# Patient Record
Sex: Female | Born: 1955 | Race: White | Hispanic: No | Smoking: Former smoker
Health system: Southern US, Community
[De-identification: ages and names within clinical notes are randomized; demographics above are authoritative.]

## PROBLEM LIST (undated history)

## (undated) DIAGNOSIS — M1990 Unspecified osteoarthritis, unspecified site: Secondary | ICD-10-CM

## (undated) DIAGNOSIS — E785 Hyperlipidemia, unspecified: Secondary | ICD-10-CM

## (undated) DIAGNOSIS — K219 Gastro-esophageal reflux disease without esophagitis: Secondary | ICD-10-CM

## (undated) DIAGNOSIS — R7303 Prediabetes: Secondary | ICD-10-CM

## (undated) DIAGNOSIS — G510 Bell's palsy: Secondary | ICD-10-CM

## (undated) DIAGNOSIS — E669 Obesity, unspecified: Secondary | ICD-10-CM

## (undated) HISTORY — DX: Hyperlipidemia, unspecified: E78.5

## (undated) HISTORY — DX: Prediabetes: R73.03

## (undated) HISTORY — DX: Gastro-esophageal reflux disease without esophagitis: K21.9

## (undated) HISTORY — DX: Unspecified osteoarthritis, unspecified site: M19.90

## (undated) HISTORY — DX: Bell's palsy: G51.0

## (undated) HISTORY — PX: TOTAL KNEE ARTHROPLASTY: SHX125

## (undated) HISTORY — DX: Obesity, unspecified: E66.9

## (undated) HISTORY — PX: TOTAL ABDOMINAL HYSTERECTOMY: SHX209

## (undated) HISTORY — PX: ABDOMINAL HYSTERECTOMY: SHX81

---

## 2012-03-17 ENCOUNTER — Emergency Department (HOSPITAL_COMMUNITY): Payer: BC Managed Care – PPO

## 2012-03-17 ENCOUNTER — Encounter (HOSPITAL_COMMUNITY): Payer: Self-pay | Admitting: Emergency Medicine

## 2012-03-17 ENCOUNTER — Emergency Department (HOSPITAL_COMMUNITY)
Admission: EM | Admit: 2012-03-17 | Discharge: 2012-03-17 | Disposition: A | Discharge status: Home / Self Care | Payer: BC Managed Care – PPO | Attending: Emergency Medicine | Admitting: Emergency Medicine

## 2012-03-17 DIAGNOSIS — G510 Bell's palsy: Secondary | ICD-10-CM | POA: Insufficient documentation

## 2012-03-17 LAB — COMPREHENSIVE METABOLIC PANEL
ALT: 18 U/L (ref 0–35)
AST: 18 U/L (ref 0–37)
Albumin: 3.9 g/dL (ref 3.5–5.2)
Alkaline Phosphatase: 82 U/L (ref 39–117)
BUN: 14 mg/dL (ref 6–23)
CO2: 24 mEq/L (ref 19–32)
Calcium: 10 mg/dL (ref 8.4–10.5)
Chloride: 104 mEq/L (ref 96–112)
Creatinine, Ser: 0.69 mg/dL (ref 0.50–1.10)
GFR calc Af Amer: >90 mL/min (ref >90)
GFR calc non Af Amer: >90 mL/min (ref >90)
Glucose, Bld: 99 mg/dL (ref 70–99)
Potassium: 4 mEq/L (ref 3.5–5.1)
Sodium: 140 mEq/L (ref 135–145)
Total Bilirubin: 0.3 mg/dL (ref 0.3–1.2)
Total Protein: 7.2 g/dL (ref 6.0–8.3)

## 2012-03-17 LAB — DIFFERENTIAL
Basophils Absolute: 0 10*3/uL (ref 0.0–0.1)
Basophils Relative: 1 % (ref 0–1)
Eosinophils Absolute: 0.2 10*3/uL (ref 0.0–0.7)
Eosinophils Relative: 4 % (ref 0–5)
Lymphocytes Relative: 42 % (ref 12–46)
Lymphs Abs: 2.4 10*3/uL (ref 0.7–4.0)
Monocytes Absolute: 0.3 10*3/uL (ref 0.1–1.0)
Monocytes Relative: 6 % (ref 3–12)
Neutro Abs: 2.7 10*3/uL (ref 1.7–7.7)
Neutrophils Relative %: 48 % (ref 43–77)

## 2012-03-17 LAB — PROTIME-INR
INR: 1.01 (ref 0.00–1.49)
Prothrombin Time: 13.2 seconds (ref 11.6–15.2)

## 2012-03-17 LAB — POCT I-STAT, CHEM 8
BUN: 14 mg/dL (ref 6–23)
Calcium, Ion: 1.27 mmol/L — ABNORMAL HIGH (ref 1.12–1.23)
Chloride: 106 mEq/L (ref 96–112)
Creatinine, Ser: 0.7 mg/dL (ref 0.50–1.10)
Glucose, Bld: 97 mg/dL (ref 70–99)
HCT: 39 % (ref 36.0–46.0)
Hemoglobin: 13.3 g/dL (ref 12.0–15.0)
Potassium: 3.9 mEq/L (ref 3.5–5.1)
Sodium: 142 mEq/L (ref 135–145)
TCO2: 26 mmol/L (ref 0–100)

## 2012-03-17 LAB — CBC
HCT: 38.8 % (ref 36.0–46.0)
Hemoglobin: 12.6 g/dL (ref 12.0–15.0)
MCH: 29.3 pg (ref 26.0–34.0)
MCHC: 32.5 g/dL (ref 30.0–36.0)
MCV: 90.2 fL (ref 78.0–100.0)
Platelets: 246 10*3/uL (ref 150–400)
RBC: 4.3 MIL/uL (ref 3.87–5.11)
RDW: 13.5 % (ref 11.5–15.5)
WBC: 5.7 10*3/uL (ref 4.0–10.5)

## 2012-03-17 LAB — TROPONIN I: Troponin I: <0.3 ng/mL (ref <0.30)

## 2012-03-17 LAB — APTT: aPTT: 32 seconds (ref 24–37)

## 2012-03-17 MED ORDER — PREDNISONE 20 MG PO TABS: 60.0000 mg | ORAL_TABLET | Freq: Every day | ORAL | Status: DC

## 2012-03-17 MED ORDER — LORAZEPAM 2 MG/ML IJ SOLN
1.0000 mg | Freq: Once | INTRAMUSCULAR | Status: AC
Start: 2012-03-17 — End: 2012-03-17
  Administered 2012-03-17: 1 mg via INTRAVENOUS
  Filled 2012-03-17: qty 1

## 2012-03-17 MED ORDER — PREDNISONE (PAK) 10 MG PO TABS: ORAL_TABLET | ORAL | Status: DC

## 2012-03-17 NOTE — ED Provider Notes (Signed)
History     CSN: [id number] Arrival date & time 03/17/12  5621  First MD Initiated Contact with Patient 03/17/12 0757      Chief Complaint  Patient presents with  . Facial Droop    The history is provided by the patient.   the patient noticed yesterday that she bit her lip but did not think much of it. When she woke up this morning she was brushing her teeth and felt that the left side of her face was numb. She also noticed that she was having trouble moving the left side of her mouth. The patient went to work but her coworkers encouraged her to come to the hospital. They noticed that she was having difficulty moving the left side of her face. Patient denies any headache. She denies any trouble with her balance or coordination. She denies any weakness or numbness in her extremities. Patient has never had anything similar to this. She has not noticed anything that makes it particularly better or worse although the symptoms do seem to be increasing since this morning  No past medical history on file.  No past surgical history on file.  No family history on file.  History  Substance Use Topics  . Smoking status: Not on file  . Smokeless tobacco: Not on file  . Alcohol Use: Not on file    OB History    No data available      Review of Systems  All other systems reviewed and are negative.    Allergies  Review of patient's allergies indicates not on file.  Home Medications  No current outpatient prescriptions on file.  BP 157/75  Pulse 73  Temp 99.7 F (37.6 C) (Oral)  Resp 18  SpO2 97%  Physical Exam  Nursing note and vitals reviewed. Constitutional: She is oriented to person, place, and time. She appears well-developed and well-nourished. No distress.  HENT:  Head: Normocephalic and atraumatic.  Right Ear: External ear normal.  Left Ear: External ear normal.  Mouth/Throat: Oropharynx is clear and moist.  Eyes: Conjunctivae normal are normal. Right eye exhibits  no discharge. Left eye exhibits no discharge. No scleral icterus.  Neck: Neck supple. No tracheal deviation present.  Cardiovascular: Normal rate, regular rhythm and intact distal pulses.   Pulmonary/Chest: Effort normal and breath sounds normal. No stridor. No respiratory distress. She has no wheezes. She has no rales.  Abdominal: Soft. Bowel sounds are normal. She exhibits no distension. There is no tenderness. There is no rebound and no guarding.  Musculoskeletal: She exhibits no edema and no tenderness.  Neurological: She is alert and oriented to person, place, and time. She has normal strength. A cranial nerve deficit (Left-sided facial palsy with partial involvement of the left side of the forehead) is present. No sensory deficit. She exhibits normal muscle tone. She displays no seizure activity. Coordination normal.       No pronator drift bilateral upper extrem, able to hold both legs off bed for 5 seconds, sensation intact in all extremities, no visual field cuts, no left or right sided neglect  Skin: Skin is warm and dry. No rash noted.  Psychiatric: She has a normal mood and affect.    ED Course  Procedures (including critical care time)  Labs Reviewed  POCT I-STAT, CHEM 8 - Abnormal; Notable for the following:    Calcium, Ion 1.27 (*)     All other components within normal limits  PROTIME-INR  APTT  CBC  DIFFERENTIAL  COMPREHENSIVE METABOLIC PANEL  TROPONIN I   Mr Brain Wo Contrast  03/17/2012  *RADIOLOGY REPORT*  Clinical Data: Left-sided facial droop.  MRI HEAD WITHOUT CONTRAST  Technique:  Multiplanar, multiecho pulse sequences of the brain and surrounding structures were obtained according to standard protocol without intravenous contrast.  Comparison: None.  Findings: The patient had difficulty remaining motionless for the study. Ativan was given to aid with anxiety.  Images are suboptimal.  Small or subtle lesions could be overlooked.  There is no evidence for acute  infarction, intracranial hemorrhage, mass lesion, hydrocephalus, or extra-axial fluid.  There is no atrophy or white matter disease.  Major intracranial vessels structures appear patent.  There is no pituitary or cerebellar tonsil abnormality.  Calvarium and skull base appear intact.  There is no visible sinus or mastoid disease.  The orbits are negative. Within limits of detection on noncontrast brain MR, no visible temporal bone abnormality.  IMPRESSION: Negative exam.   Original Report Authenticated By: Davonna Belling, M.D.      1. Bell's palsy       MDM  Her exam and history is most suggestive of a Bell's palsy however the left side of the forehead is not completely involved. There is asymmetry in her right eyebrow it does raise higher than the left but we will get an MRI this morning to confirm no sign of cerebrovascular accident  MRI does not show CVA.  Consistent with bell's palsy.  Will rx steroids. Follow up with PCP        Celene Kras, MD 03/17/12 [id number]

## 2012-03-17 NOTE — ED Notes (Signed)
Pt reports left sided facial asymetry since yesterday afternoon, on arrival has equal grips, on slurred speech and no other neuro deficits, MD to bedside - states pt most likely has Bell's Palsey, took ASA pta, no pain, A/O X4, NAD

## 2012-03-17 NOTE — ED Notes (Signed)
PT made aware that she cannot drive for 6-8 hours. Pt states she had ride with friend.

## 2012-04-15 ENCOUNTER — Ambulatory Visit (INDEPENDENT_AMBULATORY_CARE_PROVIDER_SITE_OTHER): Payer: BC Managed Care – PPO | Admitting: Family Medicine

## 2012-04-15 VITALS — BP 128/85 | HR 64 | Temp 98.3°F | Resp 16 | Ht 65.0 in | Wt 250.2 lb

## 2012-04-15 DIAGNOSIS — H538 Other visual disturbances: Secondary | ICD-10-CM

## 2012-04-15 DIAGNOSIS — G510 Bell's palsy: Secondary | ICD-10-CM

## 2012-04-15 NOTE — Patient Instructions (Signed)
Your Bell's palsy symptoms appear to be improving - this can be a continual process over months. If you are not continuing to improve, or any change/worsening sooner - you may need to follow up with a neurologist.  We will refer you to an eye specialist to evaluate the blurry vision/trouble focusing - but this is in both eyes, and unlikely due to the Bell's palsy.  Return to the clinic or go to the nearest emergency room if any of your symptoms worsen or new symptoms occur.

## 2012-04-15 NOTE — Progress Notes (Signed)
  Subjective:    Patient ID: Teresa Strong, female    DOB: [birth year], 57 y.o.   MRN: [id number]  HPI Teresa Strong is a 57 y.o. female Seen in ER 03/17/12 for L sided facial weakness, diagnosed with Bell's palsy.  MRI brain WNL.  Rx prednisone at 60mg  , then taper.    Started improving last week - some improvement in face weakness. Still slight weakness in L mouth. Feels like eyesight is somewhat blurry, feels like in both sides, not just L eye.   Eyes watery, not dry. - hard to focus initially - but able to eventually focus. No other weakness.  No headache.   Last optho eval in 2012. establishing care.      Review of Systems  Eyes: Positive for discharge (watery only. ) and visual disturbance. Negative for photophobia, pain and redness.  Skin: Negative for rash.  Neurological: Positive for weakness (slight L lower face only.). Negative for headaches.       Objective:   Physical Exam  Vitals reviewed. Constitutional: She is oriented to person, place, and time. She appears well-developed and well-nourished. No distress.  HENT:  Head: Normocephalic and atraumatic.  Right Ear: External ear normal.  Left Ear: External ear normal.  Mouth/Throat: Oropharynx is clear and moist.  Eyes: EOM and lids are normal. Pupils are equal, round, and reactive to light. Right conjunctiva is not injected. Right conjunctiva has no hemorrhage. Left conjunctiva is not injected. Left conjunctiva has no hemorrhage.  Cardiovascular: Normal rate, regular rhythm, normal heart sounds and intact distal pulses.   Pulmonary/Chest: Effort normal and breath sounds normal.  Neurological: She is alert and oriented to person, place, and time. She has normal strength. She displays a negative Romberg sign. Coordination and gait normal.       No pronator drift.  Slight decreased movement in L mouth, able to smile with slight asymmetry. Able to close eye with forced closure and standard blink.   Skin: Skin is warm and dry. No  rash noted.  Psychiatric: She has a normal mood and affect. Her behavior is normal.   Vision noted.     Assessment & Plan:  Teresa Strong is a 57 y.o. female 1. Blurry vision  Ambulatory referral to Ophthalmology  2. Bell's palsy  Ambulatory referral to Ophthalmology   Improved palsy sx's, discussed neuro eval if not improving, and rtc/er precautions.   Blurry vision - bilateral. Unlikely d/t Bell's.  Able to close eyes, and watery, not dry eyes, but will refer to optho for eval.  Patient Instructions  Your Bell's palsy symptoms appear to be improving - this can be a continual process over months. If you are not continuing to improve, or any change/worsening sooner - you may need to follow up with a neurologist.  We will refer you to an eye specialist to evaluate the blurry vision/trouble focusing - but this is in both eyes, and unlikely due to the Bell's palsy.  Return to the clinic or go to the nearest emergency room if any of your symptoms worsen or new symptoms occur.

## 2012-05-25 ENCOUNTER — Encounter: Payer: BC Managed Care – PPO | Admitting: Family Medicine

## 2012-07-06 ENCOUNTER — Encounter: Payer: BC Managed Care – PPO | Admitting: Family Medicine

## 2012-08-24 ENCOUNTER — Ambulatory Visit (INDEPENDENT_AMBULATORY_CARE_PROVIDER_SITE_OTHER): Payer: BC Managed Care – PPO | Admitting: Family Medicine

## 2012-08-24 ENCOUNTER — Encounter: Payer: Self-pay | Admitting: Family Medicine

## 2012-08-24 VITALS — BP 120/66 | HR 79 | Temp 98.7°F | Resp 16 | Ht 63.5 in | Wt 251.6 lb

## 2012-08-24 DIAGNOSIS — K219 Gastro-esophageal reflux disease without esophagitis: Secondary | ICD-10-CM

## 2012-08-24 DIAGNOSIS — Z1231 Encounter for screening mammogram for malignant neoplasm of breast: Secondary | ICD-10-CM

## 2012-08-24 DIAGNOSIS — Z23 Encounter for immunization: Secondary | ICD-10-CM

## 2012-08-24 DIAGNOSIS — Z0000 Encounter for general adult medical examination without abnormal findings: Secondary | ICD-10-CM

## 2012-08-24 DIAGNOSIS — Z1239 Encounter for other screening for malignant neoplasm of breast: Secondary | ICD-10-CM

## 2012-08-24 DIAGNOSIS — K2270 Barrett's esophagus without dysplasia: Secondary | ICD-10-CM

## 2012-08-24 DIAGNOSIS — N951 Menopausal and female climacteric states: Secondary | ICD-10-CM

## 2012-08-24 DIAGNOSIS — Z90710 Acquired absence of both cervix and uterus: Secondary | ICD-10-CM

## 2012-08-24 LAB — POCT GLYCOSYLATED HEMOGLOBIN (HGB A1C): Hemoglobin A1C: 5.3

## 2012-08-24 LAB — POCT URINALYSIS DIPSTICK
Bilirubin, UA: NEGATIVE
Blood, UA: NEGATIVE
Glucose, UA: 100
Ketones, UA: NEGATIVE
Leukocytes, UA: NEGATIVE
Nitrite, UA: NEGATIVE
Protein, UA: NEGATIVE
Spec Grav, UA: >=1.03
Urobilinogen, UA: 1
pH, UA: 5.5

## 2012-08-24 LAB — GLUCOSE, POCT (MANUAL RESULT ENTRY): POC Glucose: 90 mg/dl (ref 70–99)

## 2012-08-24 NOTE — Progress Notes (Signed)
  Subjective:    Patient ID: Teresa Strong, female    DOB: [birth year], 57 y.o.   MRN: [id number]  HPI    Review of Systems  Constitutional: Negative.   HENT: Negative.   Eyes: Negative.   Respiratory: Negative.   Cardiovascular: Negative.   Gastrointestinal: Negative.   Endocrine:       Hotflashes  Genitourinary: Negative.   Musculoskeletal: Positive for back pain and arthralgias.       Arthritis-joint  Skin: Negative.   Allergic/Immunologic: Negative.   Neurological: Negative.   Hematological: Negative.   Psychiatric/Behavioral: Negative.        Objective:   Physical Exam        Assessment & Plan:

## 2012-08-24 NOTE — Patient Instructions (Signed)
You should receive a call or letter about your lab results within the next week to 10 days.  We will refer you to [facility] and gastroenterology.  We also referred you for a mammogram. Call to schedule a dentist visit.  You can take the prescription for shingle vaccine to your pharmacy if you would like to have this done. Recommend flu vaccine this fall.  Can follow up to discuss back pains if any new/worseing symptoms. Keeping You Healthy  Get These Tests  Blood Pressure- Have your blood pressure checked by your healthcare provider at least once a year.  Normal blood pressure is 120/80.  Weight- Have your body mass index (BMI) calculated to screen for obesity.  BMI is a measure of body fat based on height and weight.  You can calculate your own BMI at https://www.west-esparza.com/  Cholesterol- Have your cholesterol checked every year.  Diabetes- Have your blood sugar checked every year if you have high blood pressure, high cholesterol, a family history of diabetes or if you are overweight.  Pap Smear- Have a pap smear every 1 to 3 years if you have been sexually active.  If you are older than 65 and recent pap smears have been normal you may not need additional pap smears.  In addition, if you have had a hysterectomy  For benign disease additional pap smears are not necessary.  Mammogram-Yearly mammograms are essential for early detection of breast cancer  Screening for Colon Cancer- Colonoscopy starting at age 2. Screening may begin sooner depending on your family history and other health conditions.  Follow up colonoscopy as directed by your Gastroenterologist.  Screening for Osteoporosis- Screening begins at age 37 with bone density scanning, sooner if you are at higher risk for developing Osteoporosis.  Get these medicines  Calcium with Vitamin D- Your body requires 1200-1500 mg of Calcium a day and [phone] IU of Vitamin D a day.  You can only absorb 500 mg of Calcium at a time therefore  Calcium must be taken in 2 or 3 separate doses throughout the day.  Hormones- Hormone therapy has been associated with increased risk for certain cancers and heart disease.  Talk to your healthcare provider about if you need relief from menopausal symptoms.  Aspirin- Ask your healthcare provider about taking Aspirin to prevent Heart Disease and Stroke.  Get these Immuniztions  Flu shot- Every fall  Pneumonia shot- Once after the age of 26; if you are younger ask your healthcare provider if you need a pneumonia shot.  Tetanus- Every ten years.  Zostavax- Once after the age of 8 to prevent shingles.  Take these steps  Don't smoke- Your healthcare provider can help you quit. For tips on how to quit, ask your healthcare provider or go to www.smokefree.gov or call 1-800 QUIT-NOW.  Be physically active- Exercise 5 days a week for a minimum of 30 minutes.  If you are not already physically active, start slow and gradually work up to 30 minutes of moderate physical activity.  Try walking, dancing, bike riding, swimming, etc.  Eat a healthy diet- Eat a variety of healthy foods such as fruits, vegetables, whole grains, low fat milk, low fat cheeses, yogurt, lean meats, chicken, fish, eggs, dried beans, tofu, etc.  For more information go to www.thenutritionsource.org  Dental visit- Brush and floss teeth twice daily; visit your dentist twice a year.  Eye exam- Visit your Optometrist or Ophthalmologist yearly.  Drink alcohol in moderation- Limit alcohol intake to one drink or  less a day.  Never drink and drive.  Depression- Your emotional health is as important as your physical health.  If you're feeling down or losing interest in things you normally enjoy, please talk to your healthcare provider.  Seat Belts- can save your life; always wear one  Smoke/Carbon Monoxide detectors- These detectors need to be installed on the appropriate level of your home.  Replace batteries at least once a  year.  Violence- If anyone is threatening or hurting you, please tell your healthcare provider.  Living Will/ Health care power of attorney- Discuss with your healthcare provider and family.

## 2012-08-24 NOTE — Progress Notes (Signed)
Subjective:    Patient ID: Teresa Strong, female    DOB: 1956/03/05, 57 y.o.   MRN: [id number]  HPI Teresa Strong is a 57 y.o. female Here for CPE: Colonoscopy: 2011 - few polyps - benign prior GI in [place]. Hx of GERD, recommended endoscopy Q 2 years for Barrret's Esophagus. Needs local referral.  On Prilosec 20mg  qd.  MMG: 2012 - normal, in [facility].  Pap: 2012 - hysterectomy at 57 yo for prolapsed uterus. 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 Prior on Premarin, then discontinued after a year and a half. No relief with lack Cohosh.  Has not tried soy.  Plans on discussing with OBGYN.  Dentist: not established yet - will be scheduling.  Did not like prior practice ([facility]).  Zostavax: has not had yet.   Advanced directives: will discuss with family, full code. EKG 2009: palpitations - was on diet pills in past, and rxn to dental anesthesia? Rare now. No knwn hx heart disease.  Last td unknown - atleast 10 years   Seen in 04/15/12 after Bell's Palsy diagnosed in ER 03/17/12 for L sided facial weakness,  MRI brain WNL.  Rx prednisone at 60mg  , then tapered.  Improving at last ov, some improvement in face weakness. Still slight weakness in L mouth. Eyesight is somewhat blurry, feels like in both sides, not just L eye.   Eyes watery, not dry. - hard to focus initially - but able to eventually focus. No other weakness.  No headache. Referred to optho for eval, but no concerns - thought watering d/t Bell's palsy. Plans on follow up for contacts.  Now feels like 100%recovered - no residual weaknss or deficits.   SH: lead verification dept for [facility]- outpatient surgery centers.   Review of Systems 13 point review of systems per patient health survey noted.  Negative other than as indicated on reviewed nursing note.   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   Hot flushes - as above., notes more at night. No unexplained weight changes, no hair changes, no heat or cold intolerance, no skin changes.      Objective:   Physical Exam  Vitals reviewed. Constitutional: She is oriented to person, place, and time. She appears well-developed and well-nourished.  HENT:  Head: Normocephalic and atraumatic.  Right Ear: External ear normal.  Left Ear: External ear normal.  Mouth/Throat: Oropharynx is clear and moist.  Eyes: Conjunctivae are normal. Pupils are equal, round, and reactive to light.  Neck: Normal range of motion. Neck supple. No thyromegaly present.  Cardiovascular: Normal rate, regular rhythm, normal heart sounds and intact distal pulses.   No murmur heard. Pulmonary/Chest: Effort normal and breath sounds normal. No respiratory distress. She has no wheezes.  Abdominal: Soft. Bowel sounds are normal. There is no tenderness.  Musculoskeletal: Normal range of motion. She exhibits no edema and no tenderness.  Lymphadenopathy:    She has no cervical adenopathy.  Neurological: She is alert and oriented to person, place, and time.  Skin: Skin is warm and dry. No rash noted.  Psychiatric: She has a normal mood and affect. Her behavior is normal. Thought content normal.      Results for orders placed in visit on 08/24/12  GLUCOSE, POCT (MANUAL RESULT ENTRY)      Result Value Range   POC Glucose 90  70 - 99 mg/dl  POCT GLYCOSYLATED HEMOGLOBIN (HGB A1C)      Result Value Range   Hemoglobin A1C 5.3    POCT URINALYSIS DIPSTICK      Result Value Range   Color, UA yellow     Clarity, UA clear     Glucose, UA 100     Bilirubin, UA neg     Ketones, UA neg     Spec Grav, UA >=1.030     Blood, UA neg     pH, UA 5.5     Protein, UA neg     Urobilinogen, UA 1.0     Nitrite, UA neg     Leukocytes, UA Negative         Assessment & Plan:  Teresa Strong is a 57 y.o. female GERD (gastroesophageal reflux disease) - Plan:  Ambulatory referral to Gastroenterology  Barrett's esophagus - Plan: Ambulatory referral to Gastroenterology  Annual physical exam - Plan: MM Digital Screening, CBC with Differential, POCT glucose (manual entry), POCT glycosylated hemoglobin (Hb A1C), POCT urinalysis dipstick, Comprehensive metabolic panel, Lipid panel  Screening for breast cancer - Plan: MM Digital Screening  Need for Tdap vaccination  Need for Zostavax administration  Menopausal syndrome (hot flushes) - Plan: Ambulatory referral to Gynecology  History of hysterectomy - Plan: Ambulatory referral to Gynecology   CPE - fasting bloodwork this am. Will check CBC, CMP, lipids. Refer for MMG, OBGYN for pap and eval of hot flushes/establish care.  zostavax Rx given. tdap updated.   Glycosuria, normal glucose and Aic.  No further workup at this point, but watch diet, recheck levels Q6 months to a year.   GERD/Barret's esophagus - refer to GI for follow up and endoscopic monitoring. Continue prilosec.   Hot flushes - hx of hysterectomy for prolapse, then subsequent oophorectomy in 2007. Would like to be referred for follow up with OBGYN - referred to [facility] - Dr. Renaldo Fiddler or Dr. Vincente Poli.   Patient Instructions  You should receive a call or letter about your lab results within the next week to 10 days.  We will refer you to [facility] and gastroenterology.  We also referred you for a mammogram. Call to schedule a dentist visit.  You can take the prescription for shingle vaccine to your pharmacy if you would like to have this done. Recommend flu vaccine this fall.  Can follow up to discuss back pains if any new/worseing symptoms. Keeping You Healthy  Get These Tests  Blood Pressure- Have your blood pressure checked by your healthcare provider at least once a year.  Normal blood pressure is 120/80.  Weight- Have your body mass index (BMI) calculated to screen for obesity.  BMI is a measure of body fat based on height  and weight.  You can calculate your own BMI at https://www.west-esparza.com/  Cholesterol- Have your cholesterol checked every year.  Diabetes- Have your blood sugar checked every year if you have high blood pressure, high cholesterol, a family history of diabetes or if you are overweight.  Pap Smear- Have a pap smear every 1 to 3 years if you have been sexually active.  If you are older than 65 and recent pap smears have been normal you may not need additional pap smears.  In addition, if you have had a hysterectomy  For benign disease additional pap smears are not necessary.  Mammogram-Yearly mammograms are essential for early detection of breast cancer  Screening for Colon Cancer- Colonoscopy starting at age 33.  Screening may begin sooner depending on your family history and other health conditions.  Follow up colonoscopy as directed by your Gastroenterologist.  Screening for Osteoporosis- Screening begins at age 56 with bone density scanning, sooner if you are at higher risk for developing Osteoporosis.  Get these medicines  Calcium with Vitamin D- Your body requires 1200-1500 mg of Calcium a day and [phone] IU of Vitamin D a day.  You can only absorb 500 mg of Calcium at a time therefore Calcium must be taken in 2 or 3 separate doses throughout the day.  Hormones- Hormone therapy has been associated with increased risk for certain cancers and heart disease.  Talk to your healthcare provider about if you need relief from menopausal symptoms.  Aspirin- Ask your healthcare provider about taking Aspirin to prevent Heart Disease and Stroke.  Get these Immuniztions  Flu shot- Every fall  Pneumonia shot- Once after the age of 34; if you are younger ask your healthcare provider if you need a pneumonia shot.  Tetanus- Every ten years.  Zostavax- Once after the age of 66 to prevent shingles.  Take these steps  Don't smoke- Your healthcare provider can help you quit. For tips on how to quit,  ask your healthcare provider or go to www.smokefree.gov or call 1-800 QUIT-NOW.  Be physically active- Exercise 5 days a week for a minimum of 30 minutes.  If you are not already physically active, start slow and gradually work up to 30 minutes of moderate physical activity.  Try walking, dancing, bike riding, swimming, etc.  Eat a healthy diet- Eat a variety of healthy foods such as fruits, vegetables, whole grains, low fat milk, low fat cheeses, yogurt, lean meats, chicken, fish, eggs, dried beans, tofu, etc.  For more information go to www.thenutritionsource.org  Dental visit- Brush and floss teeth twice daily; visit your dentist twice a year.  Eye exam- Visit your Optometrist or Ophthalmologist yearly.  Drink alcohol in moderation- Limit alcohol intake to one drink or less a day.  Never drink and drive.  Depression- Your emotional health is as important as your physical health.  If you're feeling down or losing interest in things you normally enjoy, please talk to your healthcare provider.  Seat Belts- can save your life; always wear one  Smoke/Carbon Monoxide detectors- These detectors need to be installed on the appropriate level of your home.  Replace batteries at least once a year.  Violence- If anyone is threatening or hurting you, please tell your healthcare provider.  Living Will/ Health care power of attorney- Discuss with your healthcare provider and family.

## 2012-08-25 LAB — COMPREHENSIVE METABOLIC PANEL
ALT: 12 U/L (ref 0–35)
AST: 15 U/L (ref 0–37)
Albumin: 4.5 g/dL (ref 3.5–5.2)
Alkaline Phosphatase: 80 U/L (ref 39–117)
BUN: 15 mg/dL (ref 6–23)
CO2: 26 mEq/L (ref 19–32)
Calcium: 9.6 mg/dL (ref 8.4–10.5)
Chloride: 106 mEq/L (ref 96–112)
Creat: 0.66 mg/dL (ref 0.50–1.10)
Glucose, Bld: 84 mg/dL (ref 70–99)
Potassium: 4.1 mEq/L (ref 3.5–5.3)
Sodium: 140 mEq/L (ref 135–145)
Total Bilirubin: 0.5 mg/dL (ref 0.3–1.2)
Total Protein: 6.9 g/dL (ref 6.0–8.3)

## 2012-08-25 LAB — LIPID PANEL
Cholesterol: 197 mg/dL (ref 0–200)
HDL: 49 mg/dL (ref >39)
LDL Cholesterol: 131 mg/dL — ABNORMAL HIGH (ref 0–99)
Total CHOL/HDL Ratio: 4 Ratio
Triglycerides: 86 mg/dL (ref <150)
VLDL: 17 mg/dL (ref 0–40)

## 2012-08-25 LAB — CBC WITH DIFFERENTIAL/PLATELET
Basophils Absolute: 0 10*3/uL (ref 0.0–0.1)
Basophils Relative: 0 % (ref 0–1)
Eosinophils Absolute: 0.2 10*3/uL (ref 0.0–0.7)
Eosinophils Relative: 4 % (ref 0–5)
HCT: 37.5 % (ref 36.0–46.0)
Hemoglobin: 12.8 g/dL (ref 12.0–15.0)
Lymphocytes Relative: 42 % (ref 12–46)
Lymphs Abs: 1.9 10*3/uL (ref 0.7–4.0)
MCH: 29.3 pg (ref 26.0–34.0)
MCHC: 34.1 g/dL (ref 30.0–36.0)
MCV: 85.8 fL (ref 78.0–100.0)
Monocytes Absolute: 0.3 10*3/uL (ref 0.1–1.0)
Monocytes Relative: 6 % (ref 3–12)
Neutro Abs: 2.2 10*3/uL (ref 1.7–7.7)
Neutrophils Relative %: 48 % (ref 43–77)
Platelets: 245 10*3/uL (ref 150–400)
RBC: 4.37 MIL/uL (ref 3.87–5.11)
RDW: 13.3 % (ref 11.5–15.5)
WBC: 4.6 10*3/uL (ref 4.0–10.5)

## 2012-09-03 ENCOUNTER — Ambulatory Visit (HOSPITAL_BASED_OUTPATIENT_CLINIC_OR_DEPARTMENT_OTHER)
Admission: RE | Admit: 2012-09-03 | Discharge: 2012-09-03 | Disposition: A | Discharge status: Home / Self Care | Payer: BC Managed Care – PPO | Source: Ambulatory Visit | Attending: Family Medicine | Admitting: Family Medicine

## 2012-09-03 ENCOUNTER — Ambulatory Visit (INDEPENDENT_AMBULATORY_CARE_PROVIDER_SITE_OTHER): Payer: BC Managed Care – PPO | Admitting: Family Medicine

## 2012-09-03 VITALS — BP 136/82 | HR 64 | Temp 99.1°F | Resp 16 | Ht 63.5 in | Wt 250.0 lb

## 2012-09-03 DIAGNOSIS — M25562 Pain in left knee: Secondary | ICD-10-CM

## 2012-09-03 DIAGNOSIS — M7989 Other specified soft tissue disorders: Secondary | ICD-10-CM | POA: Insufficient documentation

## 2012-09-03 DIAGNOSIS — R609 Edema, unspecified: Secondary | ICD-10-CM

## 2012-09-03 DIAGNOSIS — M25569 Pain in unspecified knee: Secondary | ICD-10-CM

## 2012-09-03 DIAGNOSIS — M79605 Pain in left leg: Secondary | ICD-10-CM | POA: Insufficient documentation

## 2012-09-03 DIAGNOSIS — M79609 Pain in unspecified limb: Secondary | ICD-10-CM | POA: Insufficient documentation

## 2012-09-03 DIAGNOSIS — L988 Other specified disorders of the skin and subcutaneous tissue: Secondary | ICD-10-CM | POA: Insufficient documentation

## 2012-09-03 MED ORDER — IBUPROFEN 800 MG PO TABS: 800.0000 mg | ORAL_TABLET | Freq: Three times a day (TID) | ORAL | Status: DC | PRN

## 2012-09-03 NOTE — Progress Notes (Signed)
Teresa Strong is a 56 y.o. female who presents to Urgent Care today with complaints of Left leg pain:  1.  Left leg pain:  Started about 10 days ago, after she was given Tdap.  Had "flu-like symptoms" and aching for 2-3 days afterwards.  After that, she began having localized pain to LLE surrounding her knee.  Worse in back of knee.  Has noted unilateral swelling in Left leg.  No history of LE edema previously, no edema in Right leg.  She works 2 jobs, one seated and the other standing.  Pain is worse with prolonged standing, but also has pain after being seated for period of time.  Pain is worse with bending of knee.  Describes pain as "aching" that was maximally painful 2 days prior.  Has been using ice and heat with some pain relief.  Has had some locking of that leg and giving way in past several days as well.   No fevers or chills.  No shortness of breath.  No history of prior VTEs.    PMH reviewed.  Past Medical History  Diagnosis Date  . Arthritis   . GERD (gastroesophageal reflux disease)   . Bell's palsy    Past Surgical History  Procedure Laterality Date  . Abdominal hysterectomy      Medications reviewed. Current Outpatient Prescriptions  Medication Sig Dispense Refill  . omeprazole (PRILOSEC) 20 MG capsule Take 20 mg by mouth daily.       No current facility-administered medications for this visit.    ROS as above otherwise neg.  No chest pain, palpitations, SOB, Fever, Chills, Abd pain, N/V/D.   Physical Exam:  BP 136/82  Pulse 64  Temp(Src) 99.1 F (37.3 C)  Resp 16  Ht 5' 3.5" (1.613 m)  Wt 250 lb (113.399 kg)  BMI 43.59 kg/m2 Gen:  Alert, cooperative patient who appears stated age in no acute distress.  Vital signs reviewed. HEENT: EOMI,  MMM Pulm:  Clear to auscultation bilaterally with good air movement.  No wheezes or rales noted.   Cardiac:  Regular rate and rhythm without murmur auscultated.   Exts: RLE with trace edema at ankle.  LLE with +2 edema noted  to mid-shin.  Some mild redness noted to calf.   MSK:  TTP directly behind Left knee.  However I do not palpate any Baker's cyst.  Ant/post drawer negative.  No lateral or medial joint laxity noted.  Standing Apley's test is positive.  Positive Mcmurray's for pain.     Assessment and Plan:  1.  Left leg pain and swelling: - I think what she actually has is meniscal tear of her left knee.  History and exam testing corroborates this.   - However, I am concerned for unilateral leg swelling, pain, and some redness.   - Sending for Dopplers to rule out DVT.   - If these are negative, will treat as meniscal tear.   - I provided her with pain relievers and rehab to help with meniscal tears, will treat as this if dopplers negative.  - FU with PCP in 10 - 14 days to see if improved and for any further recommendations.   - I have completed a letter for her for work as well until Monday.

## 2012-09-03 NOTE — Patient Instructions (Addendum)
Go straight to [facility].  They will perform the ultrasound of your leg. They will call me with the results and we will go from there.   If this is normal, I still think it is a meniscal tear.     Meniscus Tear with Phase I Rehab The meniscus is a C-shaped cartilage structure, located in the knee joint between the thigh bone (femur) and the shinbone (tibia). Two menisci are located in each knee joint: the inner and outer meniscus. The meniscus acts as an adapter between the thigh bone and shinbone, allowing them to fit properly together. It also functions as a shock absorber, to reduce the stress placed on the knee joint and to help supply nutrients to the knee joint cartilage. As people age, the meniscus begins to harden and become more vulnerable to injury. Meniscus tears are a common injury, especially in older athletes. Inner meniscus tears are more common than outer meniscus tears.  SYMPTOMS   Pain in the knee, especially with standing or squatting with the affected leg.  Tenderness along the joint line.  Swelling in the knee joint (effusion), usually starting 1 to 2 days after injury.  Locking or catching of the knee joint, causing inability to straighten the knee completely.  Giving way or buckling of the knee. CAUSES  A meniscus tear occurs when a force is placed on the meniscus that is greater than it can handle. Common causes of injury include:  Direct hit (trauma) to the knee.  Twisting, pivoting, or cutting (rapidly changing direction while running), kneeling or squatting.  Without injury, due to aging. RISK INCREASES WITH:  Contact sports (football, rugby).  Sports in which cleats are used with pivoting (soccer, lacrosse) or sports in which good shoe grip and sudden change in direction are required (racquetball, basketball, squash).  Previous knee injury.  Associated knee injury, particularly ligament injuries.  Poor strength and  flexibility. PREVENTION  Warm up and stretch properly before activity.  Maintain physical fitness:  Strength, flexibility, and endurance.  Cardiovascular fitness.  Protect the knee with a brace or elastic bandage.  Wear properly fitted protective equipment (proper cleats for the surface). PROGNOSIS  Sometimes, meniscus tears heal on their own. However, definitive treatment requires surgery, followed by at least 6 weeks of recovery.  RELATED COMPLICATIONS   Recurring symptoms that result in a chronic problem.  Repeated knee injury, especially if sports are resumed too soon after injury or surgery.  Progression of the tear (the tear gets larger), if untreated.  Arthritis of the knee in later years (with or without surgery).  Complications of surgery, including infection, bleeding, injury to nerves (numbness, weakness, paralysis) continued pain, giving way, locking, nonhealing of meniscus (if repaired), need for further surgery, and knee stiffness (loss of motion). TREATMENT  Treatment first involves the use of ice and medicine, to reduce pain and inflammation. You may find using crutches to walk more comfortable. However, it is okay to bear weight on the injured knee, if the pain will allow it. Surgery is often advised as a definitive treatment. Surgery is performed through an incision near the joint (arthroscopically). The torn piece of the meniscus is removed, and if possible the joint cartilage is repaired. After surgery, the joint must be restrained. After restraint, it is important to perform strengthening and stretching exercises to help regain strength and a full range of motion. These exercises may be completed at home or with a therapist.  MEDICATION  If pain medicine is  needed, nonsteroidal anti-inflammatory medicines (aspirin and ibuprofen), or other minor pain relievers (acetaminophen), are often advised.  Do not take pain medicine for 7 days before surgery.  Prescription  pain relievers may be given, if your caregiver thinks they are needed. Use only as directed and only as much as you need. HEAT AND COLD  Cold treatment (icing) should be applied for 10 to 15 minutes every 2 to 3 hours for inflammation and pain, and immediately after activity that aggravates your symptoms. Use ice packs or an ice massage.  Heat treatment may be used before performing stretching and strengthening activities prescribed by your caregiver, physical therapist, or athletic trainer. Use a heat pack or a warm water soak. SEEK MEDICAL CARE IF:   Symptoms get worse or do not improve in 2 weeks, despite treatment.  New, unexplained symptoms develop. (Drugs used in treatment may produce side effects.) EXERCISES RANGE OF MOTION (ROM) AND STRETCHING EXERCISES - Meniscus Tear, Non-operative, Phase I These are some of the initial exercises with which you may start your rehabilitation program, until you see your caregiver again or until your symptoms are resolved. Remember:   These initial exercises are intended to be gentle. They will help you restore motion without increasing any swelling.  Completing these exercises allows less painful movement and prepares you for the more aggressive strengthening exercises in Phase II.  An effective stretch should be held for at least 30 seconds.  A stretch should never be painful. You should only feel a gentle lengthening or release in the stretched tissue. RANGE OF MOTION - Knee Flexion, Active  Lie on your back with both knees straight. (If this causes back discomfort, bend your healthy knee, placing your foot flat on the floor.)  Slowly slide your heel back toward your buttocks until you feel a gentle stretch in the front of your knee or thigh.  Hold for __________ seconds. Slowly slide your heel back to the starting position. Repeat __________ times. Complete this exercise __________ times per day.  RANGE OF MOTION - Knee Flexion and Extension,  Active-Assisted  Sit on the edge of a table or chair with your thighs firmly supported. It may be helpful to place a folded towel under the end of your right / left thigh.  Flexion (bending): Place the ankle of your healthy leg on top of the other ankle. Use your healthy leg to gently bend your right / left knee until you feel a mild tension across the top of your knee.  Hold for __________ seconds.  Extension (straightening): Switch your ankles so your right / left leg is on top. Use your healthy leg to straighten your right / left knee until you feel a mild tension on the backside of your knee.  Hold for __________ seconds. Repeat __________ times. Complete __________ times per day. STRETCH - Knee Flexion, Supine  Lie on the floor with your right / left heel and foot lightly touching the wall. (Place both feet on the wall if you do not use a door frame.)  Without using any effort, allow gravity to slide your foot down the wall slowly until you feel a gentle stretch in the front of your right / left knee.  Hold this stretch for __________ seconds. Then return the leg to the starting position, using your healthy leg for help, if needed. Repeat __________ times. Complete this stretch __________ times per day.  STRETCH - Knee Extension Sitting  Sit with your right / left leg/heel propped  on another chair, coffee table, or foot stool.  Allow your leg muscles to relax, letting gravity straighten out your knee.*  You should feel a stretch behind your right / left knee. Hold this position for __________ seconds. Repeat __________ times. Complete this stretch __________ times per day.  *Your physician, physical therapist or athletic trainer may instruct you place a __________ weight on your thigh, just above your kneecap, to deepen the stretch.  STRENGTHENING EXERCISES - Meniscus Tear, Non-operative, Phase I These exercises may help you when beginning to rehabilitate your injury. They may  resolve your symptoms with or without further involvement from your physician, physical therapist or athletic trainer. While completing these exercises, remember:   Muscles can gain both the endurance and the strength needed for everyday activities through controlled exercises.  Complete these exercises as instructed by your physician, physical therapist or athletic trainer. Progress the resistance and repetitions only as guided. STRENGTH - Quadriceps, Isometrics  Lie on your back with your right / left leg extended and your opposite knee bent.  Gradually tense the muscles in the front of your right / left thigh. You should see either your knee cap slide up toward your hip or increased dimpling just above the knee. This motion will push the back of the knee down toward the floor, mat, or bed on which you are lying.  Hold the muscle as tight as you can, without increasing your pain, for __________ seconds.  Relax the muscles slowly and completely between each repetition. Repeat __________ times. Complete this exercise __________ times per day.  STRENGTH - Quadriceps, Short Arcs   Lie on your back. Place a __________ inch towel roll under your right / left knee, so that the knee bends slightly.  Raise only your lower leg by tightening the muscles in the front of your thigh. Do not allow your thigh to rise.  Hold this position for __________ seconds. Repeat __________ times. Complete this exercise __________ times per day.  OPTIONAL ANKLE WEIGHTS: Begin with ____________________, but DO NOT exceed ____________________. Increase in 1 pound/0.5 kilogram increments. STRENGTH - Quadriceps, Straight Leg Raises  Quality counts! Watch for signs that the quadriceps muscle is working, to be sure you are strengthening the correct muscles and not "cheating" by substituting with healthier muscles.  Lay on your back with your right / left leg extended and your opposite knee bent.  Tense the muscles in  the front of your right / left thigh. You should see either your knee cap slide up or increased dimpling just above the knee. Your thigh may even shake a bit.  Tighten these muscles even more and raise your leg 4 to 6 inches off the floor. Hold for __________ seconds.  Keeping these muscles tense, lower your leg.  Relax the muscles slowly and completely in between each repetition. Repeat __________ times. Complete this exercise __________ times per day.  STRENGTH - Hamstring, Curls   Lay on your stomach with your legs extended. (If you lay on a bed, your feet may hang over the edge.)  Tighten the muscles in the back of your thigh to bend your right / left knee up to 90 degrees. Keep your hips flat on the bed.  Hold this position for __________ seconds.  Slowly lower your leg back to the starting position. Repeat __________ times. Complete this exercise __________ times per day.  STRENGTH  Quadriceps, Squats  Stand in a door frame so that your feet and knees are in line  with the frame.  Use your hands for balance, not support, on the frame.  Slowly lower your weight, bending at the hips and knees. Keep your lower legs upright so that they are parallel with the door frame. Squat only within the range that does not increase your knee pain. Never let your hips drop below your knees.  Slowly return upright, pushing with your legs, not pulling with your hands. Repeat __________ times. Complete this exercise __________ times per day.  STRENGTH - Quad/VMO, Isometric   Sit in a chair with your right / left knee slightly bent. With your fingertips, feel the VMO muscle just above the inside of your knee. The VMO is important in controlling the position of your kneecap.  Keeping your fingertips on this muscle. Without actually moving your leg, attempt to drive your knee down as if straightening your leg. You should feel your VMO tense. If you have a difficult time, you may wish to try the same  exercise on your healthy knee first.  Tense this muscle as hard as you can without increasing any knee pain.  Hold for __________ seconds. Relax the muscles slowly and completely in between each repetition. Repeat __________ times. Complete exercise __________ times per day.  Document Released: 04/08/1998 Document Revised: 06/17/2011 Document Reviewed: 07/07/2008 [facility] Patient Information 2014 [place], [place].

## 2012-09-04 ENCOUNTER — Telehealth: Payer: Self-pay

## 2012-09-04 NOTE — Telephone Encounter (Signed)
Faxed for her. Teresa Strong

## 2012-09-04 NOTE — Telephone Encounter (Signed)
Patient was seen last night. Said she was being helped by Dr. Gwendolyn Grant and Magda Paganini; was told a note could be faxed over to her job stating limitations she has as far as standing and so forth. Please call patient at [phone]. Fax # is [phone].

## 2012-09-09 ENCOUNTER — Ambulatory Visit: Payer: BC Managed Care – PPO

## 2012-09-21 ENCOUNTER — Telehealth: Payer: Self-pay

## 2012-09-21 MED ORDER — IBUPROFEN 800 MG PO TABS: 800.0000 mg | ORAL_TABLET | Freq: Three times a day (TID) | ORAL | Status: DC | PRN

## 2012-09-21 NOTE — Telephone Encounter (Signed)
PT plans to do a follow up with Dr. Neva Seat next week.  She was prescribed 800 mg ibuprofen for her injury and now needs a refill.  Can we call this in for her? [phone]

## 2012-09-21 NOTE — Telephone Encounter (Signed)
Sent to pharmacy.  Be sure to take with food.  Follow up with Dr. Neva Seat as planned

## 2012-09-22 ENCOUNTER — Encounter: Payer: Self-pay | Admitting: Internal Medicine

## 2012-09-22 ENCOUNTER — Ambulatory Visit: Payer: BC Managed Care – PPO

## 2012-09-22 ENCOUNTER — Ambulatory Visit (INDEPENDENT_AMBULATORY_CARE_PROVIDER_SITE_OTHER): Payer: BC Managed Care – PPO | Admitting: Internal Medicine

## 2012-09-22 VITALS — BP 121/81 | HR 80 | Temp 98.4°F | Resp 18 | Wt 251.0 lb

## 2012-09-22 DIAGNOSIS — M25562 Pain in left knee: Secondary | ICD-10-CM

## 2012-09-22 DIAGNOSIS — Z6841 Body Mass Index (BMI) 40.0 and over, adult: Secondary | ICD-10-CM

## 2012-09-22 DIAGNOSIS — M25569 Pain in unspecified knee: Secondary | ICD-10-CM

## 2012-09-22 DIAGNOSIS — K219 Gastro-esophageal reflux disease without esophagitis: Secondary | ICD-10-CM | POA: Insufficient documentation

## 2012-09-22 DIAGNOSIS — R5381 Other malaise: Secondary | ICD-10-CM

## 2012-09-22 DIAGNOSIS — R5383 Other fatigue: Secondary | ICD-10-CM

## 2012-09-22 MED ORDER — SUCRALFATE 1 GM/10ML PO SUSP: 1.0000 g | Freq: Four times a day (QID) | ORAL | Status: DC

## 2012-09-22 MED ORDER — TRAMADOL HCL 50 MG PO TABS: 50.0000 mg | ORAL_TABLET | Freq: Every day | ORAL | Status: DC

## 2012-09-22 NOTE — Progress Notes (Addendum)
  Subjective:    Patient ID: Fonnie Crookshanks, female    DOB: Sep 26, 1955, 57 y.o.   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 Elnoria Howard at the end of the month for further evaluation /history of Barrett's esophagus    Past Medical History  Diagnosis Date  . Arthritis   . GERD (gastroesophageal reflux disease)   . Bell's palsy     -  Barrett's esophagus diagnosed in [place]  Review of Systems  noncontributory except present illness No obvious sleep apnea symptoms    Objective:   Physical Exam BP 121/81  Pulse 80  Temp(Src) 98.4 F (36.9 C) (Oral)  Resp 18  Wt 251 lb (113.853 kg)  BMI 43.76 kg/m2 Abdomen benign  Left knee is not swollen but is very tender medially and laterally at the joint line and in the popliteal fossa with pressure/no laxity/patellar ballots freely/no popliteal cyst  [facility] reading (PRIMARY) by  Dr. Merla Riches degenerative changes but no obvious joint space narrowing or defects       Assessment & Plan:  Knee  pain, left - Needs orthopedic referral for potential arthroscopy or at least a more complete diagnosis Add tramadol at bedtime so she can sleep  GERD (gastroesophageal reflux disease) use Carafate for one week pending GI evaluation  Fatigue most likely related to lack of sleep with working 2 jobs  BMI 40.0-44.9, adult-discussed how  this might play a role in her knee pain

## 2012-09-22 NOTE — Telephone Encounter (Signed)
No answer, try to call back later.

## 2012-09-23 NOTE — Telephone Encounter (Signed)
Called patient to advise  °

## 2012-09-23 NOTE — Addendum Note (Signed)
Addended by: Tonye Pearson on: 09/23/2012 10:39 AM   Modules accepted: Orders

## 2012-09-28 ENCOUNTER — Telehealth: Payer: Self-pay

## 2012-09-28 NOTE — Telephone Encounter (Signed)
Request sent to xray.  °

## 2012-09-28 NOTE — Telephone Encounter (Signed)
Pt has upcoming ortho appt and requests copy of xrays to take with her. Left knee/leg  Pt best: [phone]  bf

## 2013-05-20 ENCOUNTER — Encounter: Payer: Self-pay | Admitting: Family Medicine

## 2013-05-24 ENCOUNTER — Encounter: Payer: Self-pay | Admitting: *Deleted

## 2013-06-14 ENCOUNTER — Encounter: Payer: Self-pay | Admitting: Family Medicine

## 2013-06-14 DIAGNOSIS — G510 Bell's palsy: Secondary | ICD-10-CM | POA: Insufficient documentation

## 2014-10-06 ENCOUNTER — Ambulatory Visit (INDEPENDENT_AMBULATORY_CARE_PROVIDER_SITE_OTHER): Payer: Managed Care, Other (non HMO)

## 2014-10-06 ENCOUNTER — Ambulatory Visit (INDEPENDENT_AMBULATORY_CARE_PROVIDER_SITE_OTHER): Payer: Managed Care, Other (non HMO) | Admitting: Family Medicine

## 2014-10-06 VITALS — BP 118/70 | HR 82 | Temp 98.4°F | Resp 18 | Ht 63.25 in | Wt 256.1 lb

## 2014-10-06 DIAGNOSIS — M179 Osteoarthritis of knee, unspecified: Secondary | ICD-10-CM

## 2014-10-06 DIAGNOSIS — M79644 Pain in right finger(s): Secondary | ICD-10-CM

## 2014-10-06 DIAGNOSIS — M25473 Effusion, unspecified ankle: Secondary | ICD-10-CM

## 2014-10-06 DIAGNOSIS — M79641 Pain in right hand: Secondary | ICD-10-CM | POA: Diagnosis not present

## 2014-10-06 DIAGNOSIS — M25441 Effusion, right hand: Secondary | ICD-10-CM

## 2014-10-06 DIAGNOSIS — M25579 Pain in unspecified ankle and joints of unspecified foot: Secondary | ICD-10-CM | POA: Diagnosis not present

## 2014-10-06 DIAGNOSIS — M1711 Unilateral primary osteoarthritis, right knee: Secondary | ICD-10-CM

## 2014-10-06 MED ORDER — MELOXICAM 7.5 MG PO TABS: 7.5000 mg | ORAL_TABLET | Freq: Every day | ORAL | Status: DC | PRN

## 2014-10-06 NOTE — Patient Instructions (Signed)
Your heel pain appears to be due to achilles tendon issues. Try a "heel lift" in each shoe for now, meloxicam once per day as needed (do not take with other NSAIDS), and follow up with ortho.  If they have not seen you in next 2 weeks, and pain persists - return to see me.  If pain in hand does not continue to improve - return for recheck.   Achilles Tendinitis Achilles tendinitis is inflammation of the tough, cord-like band that attaches the lower muscles of your leg to your heel (Achilles tendon). It is usually caused by overusing the tendon and joint involved.  CAUSES Achilles tendinitis can happen because of:  A sudden increase in exercise or activity (such as running).  Doing the same exercises or activities (such as jumping) over and over.  Not warming up calf muscles before exercising.  Exercising in shoes that are worn out or not made for exercise.  Having arthritis or a bone growth on the back of the heel bone. This can rub against the tendon and hurt the tendon. SIGNS AND SYMPTOMS The most common symptoms are:  Pain in the back of the leg, just above the heel. The pain usually gets worse with exercise and better with rest.  Stiffness or soreness in the back of the leg, especially in the morning.  Swelling of the skin over the Achilles tendon.  Trouble standing on tiptoe. Sometimes, an Achilles tendon tears (ruptures). Symptoms of an Achilles tendon rupture can include:  Sudden, severe pain in the back of the leg.  Trouble putting weight on the foot or walking normally. DIAGNOSIS Achilles tendinitis will be diagnosed based on symptoms and a physical examination. An X-ray may be done to check if another condition is causing your symptoms. An MRI may be ordered if your health care provider suspects you may have completely torn your tendon, which is called an Achilles tendon rupture.  TREATMENT  Achilles tendinitis usually gets better over time. It can take weeks to months to  heal completely. Treatment focuses on treating the symptoms and helping the injury heal. HOME CARE INSTRUCTIONS   Rest your Achilles tendon and avoid activities that cause pain.  Apply ice to the injured area:  Put ice in a plastic bag.  Place a towel between your skin and the bag.  Leave the ice on for 20 minutes, 2-3 times a day  Try to avoid using the tendon (other than gentle range of motion) while the tendon is painful. Do not resume use until instructed by your health care provider. Then begin use gradually. Do not increase use to the point of pain. If pain does develop, decrease use and continue the above measures. Gradually increase activities that do not cause discomfort until you achieve normal use.  Do exercises to make your calf muscles stronger and more flexible. Your health care provider or physical therapist can recommend exercises for you to do.  Wrap your ankle with an elastic bandage or other wrap. This can help keep your tendon from moving too much. Your health care provider will show you how to wrap your ankle correctly.  Only take over-the-counter or prescription medicines for pain, discomfort, or fever as directed by your health care provider. SEEK MEDICAL CARE IF:   Your pain and swelling increase or pain is uncontrolled with medicines.  You develop new, unexplained symptoms or your symptoms get worse.  You are unable to move your toes or foot.  You develop warmth and swelling  in your foot.  You have an unexplained temperature. MAKE SURE YOU:   Understand these instructions.  Will watch your condition.  Will get help right away if you are not doing well or get worse. Document Released: 01/02/2005 Document Revised: 01/13/2013 Document Reviewed: 11/04/2012 [facility] Patient Information 2015 [place], [place]. This information is not intended to replace advice given to you by your health care provider. Make sure you discuss any questions you have with your  health care provider.

## 2014-10-06 NOTE — Progress Notes (Signed)
Subjective:  This chart was scribed for Teresa Staggers, MD by [facility], medical scribe at [facility].The patient was seen in exam room 10 and the patient's care was started at 7:50 PM.   Patient ID: Teresa Strong, female    DOB: 12/19/1955, 59 y.o.   MRN: [id number] Chief Complaint  Patient presents with  . Knee Pain    C/O right knee pain  . Ankle Pain    both ankles x 2 weeks  . feet pain    both x 2 weeks  . Hand Pain    C/O right index finger pain x 1 mth-has shooting pains off & on   HPI HPI Comments: Teresa Strong is a 60 y.o. female who presents to [facility] for multiple complaints. She was diagnosed with rheumatoid arthritis in her back, shoulder, knees and ankles several years ago in [place].  Right knee pain: She was seeing Dannielle Burn PA-C at [facility], she was told she need a knee replacement. New insurance and [employer] is not covered so she needs a referral to another orthopedist. She would like to go to [facility]  Bilateral ankle and foot pain: Diagnosed with arthritis in both ankles several years ago, she had a flare up two weeks ago, without increased activity. She actually has done less activity due to her right knee pain. There is swelling in the ankles. No redness, fever, chills, bites or wounds.   Foot pain onset two weeks ago. Pain is in the back of the heel, pain usually when she is not wearing arch support.   Right index finger pain: This pain began about two weeks ago. She describes the pain as sharp and shooting up her finger. There is swelling but no redness. No history of gout. Taking Advil every two days for no relief. Swelling and pain improved over the past week. No pain the past day or two  Patient Active Problem List   Diagnosis Date Noted  . Bell's palsy 06/14/2013  . GERD (gastroesophageal reflux disease) 09/22/2012  . BMI 40.0-44.9, adult 09/22/2012  . Left  leg pain 09/03/2012  . Left leg swelling 09/03/2012   Past Medical History  Diagnosis Date  . Arthritis   . GERD (gastroesophageal reflux disease)   . Bell's palsy    Past Surgical History  Procedure Laterality Date  . Abdominal hysterectomy     No Known Allergies Prior to Admission medications   Medication Sig Start Date End Date Taking? Authorizing Provider  omeprazole (PRILOSEC) 20 MG capsule Take 20 mg by mouth daily.   Yes Historical Provider, MD  ibuprofen (ADVIL,MOTRIN) 800 MG tablet Take 1 tablet (800 mg total) by mouth every 8 (eight) hours as needed for pain. Patient not taking: Reported on 10/06/2014 09/21/12   Godfrey Pick, PA-C  sucralfate (CARAFATE) 1 GM/10ML suspension Take 10 mLs (1 g total) by mouth 4 (four) times daily. Patient not taking: Reported on 10/06/2014 09/22/12   Tonye Pearson, MD  traMADol (ULTRAM) 50 MG tablet Take 1 tablet (50 mg total) by mouth at bedtime. Patient not taking: Reported on 10/06/2014 09/22/12   Tonye Pearson, MD   History   Social History  . Marital Status: Single    Spouse Name: N/A  . Number of Children: N/A  . Years of Education: N/A   Occupational History  . Not on file.   Social History Main Topics  . Smoking status:  Former Smoker  . Smokeless tobacco: Not on file  . Alcohol Use: 0.0 oz/week    0 Standard drinks or equivalent per week     Comment: social  . Drug Use: No  . Sexual Activity: Yes    Birth Control/ Protection: Surgical     Comment: number of sex partners in the last 12 months 1   Other Topics Concern  . Not on file   Social History Narrative   Review of Systems  Constitutional: Negative for fever and chills.  Musculoskeletal: Positive for joint swelling and arthralgias.  Skin: Negative for color change and wound.      Objective:  BP 118/70 mmHg  Pulse 82  Temp(Src) 98.4 F (36.9 C) (Oral)  Resp 18  Ht 5' 3.25" (1.607 m)  Wt 256 lb 2 oz (116.178 kg)  BMI 44.99 kg/m2  SpO2  98% Physical Exam  Constitutional: She is oriented to person, place, and time. She appears well-developed and well-nourished. No distress.  HENT:  Head: Normocephalic and atraumatic.  Eyes: Pupils are equal, round, and reactive to light.  Neck: Normal range of motion.  Cardiovascular: Normal rate and regular rhythm.   Pulmonary/Chest: Effort normal. No respiratory distress.  Musculoskeletal: Normal range of motion.  Right hand:Tender at the medial aspect of 2nd PIP but minimal swelling in that area. Otherwise 2nd finger is non tender no DIP or PIP swelling. Full flexion, extension and strength. No rash. NVI distally, cap refill less than 1 second.  Right knee: crepitous of the right knee, tender along medial joint line but intact ROM.   Right ankle: Slight fullness without focal swelling. The Malleolus is non tender. The 5 th MTP and navicula are non-tender. Pain with dorsi flexion. She is most tender over the insertion of the achillis, retrocalcaneal bursa.  Left ankle: Slight fullness without focal swelling. The Malleolus is non tender. The 5 th MTP and navicula are non-tender. Pain with dorsi flexion. She is most tender over the insertion of the achillis, retrocalcaneal bursa.  With standing has pain along the plantar aspect of the right heel. Negative lateral squeeze.   Neurological: She is alert and oriented to person, place, and time.  Skin: Skin is warm and dry.  Psychiatric: She has a normal mood and affect. Her behavior is normal.  Nursing note and vitals reviewed.  [facility] reading (PRIMARY) by Dr.Venora Kautzman: Left ankle: achillis insertion spur, no acute bony findings. Right ankle: large amount of calcification  at achillis insertion with fragmentation. Right hand: No acute bony findings.    Assessment & Plan:   Teresa Strong is a 59 y.o. female Osteoarthritis of right knee, unspecified osteoarthritis type - Plan: Ambulatory referral to Orthopedic Surgery, meloxicam (MOBIC) 7.5 MG  tablet  -Severe arthritis by history, status post steroid injections, with prior plan of knee replacement. Needs referral to different orthopedist due to insurance changes, and will refer to Dr. Thurston Hole at [facility] orthoped[facility]. Mobic as needed in the meantime.  Pain in finger of right hand, Swelling joint, finger, hand, right - Plan: meloxicam (MOBIC) 7.5 MG tablet, DG Hand Complete Right  Improving. No definite acute bony findings on x-ray. As this is improving, didn't sided against checking ASO, sedimentation rate, uric acid, or other rheumatologic tests at this time. However if persistent or other small joints involved would consider checking some of these tests or possible rheumatology eval.  mobic if needed.   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   -temporary heel lift for comfort, meloxicam if needed for discomfort, and plan to follow-up with ortho.   -RTC precautions    Meds ordered this encounter  Medications  . meloxicam (MOBIC) 7.5 MG tablet    Sig: Take 1 tablet (7.5 mg total) by mouth daily as needed for pain.    Dispense:  30 tablet    Refill:  0   Patient Instructions  Your heel pain appears to be due to achilles tendon issues. Try a "heel lift" in each shoe for now, meloxicam once per day as needed (do not take with other NSAIDS), and follow up with ortho.  If they have not seen you in next 2 weeks, and pain persists - return to see me.  If pain in hand does not continue to improve - return for recheck.   Achilles Tendinitis Achilles tendinitis is inflammation of the tough, cord-like band that attaches the lower muscles of your leg to your heel (Achilles tendon). It is usually caused by overusing the tendon and joint involved.  CAUSES Achilles tendinitis can happen  because of:  A sudden increase in exercise or activity (such as running).  Doing the same exercises or activities (such as jumping) over and over.  Not warming up calf muscles before exercising.  Exercising in shoes that are worn out or not made for exercise.  Having arthritis or a bone growth on the back of the heel bone. This can rub against the tendon and hurt the tendon. SIGNS AND SYMPTOMS The most common symptoms are:  Pain in the back of the leg, just above the heel. The pain usually gets worse with exercise and better with rest.  Stiffness or soreness in the back of the leg, especially in the morning.  Swelling of the skin over the Achilles tendon.  Trouble standing on tiptoe. Sometimes, an Achilles tendon tears (ruptures). Symptoms of an Achilles tendon rupture can include:  Sudden, severe pain in the back of the leg.  Trouble putting weight on the foot or walking normally. DIAGNOSIS Achilles tendinitis will be diagnosed based on symptoms and a physical examination. An X-ray may be done to check if another condition is causing your symptoms. An MRI may be ordered if your health care provider suspects you may have completely torn your tendon, which is called an Achilles tendon rupture.  TREATMENT  Achilles tendinitis usually gets better over time. It can take weeks to months to heal completely. Treatment focuses on treating the symptoms and helping the injury heal. HOME CARE INSTRUCTIONS   Rest your Achilles tendon and avoid activities that cause pain.  Apply ice to the injured area:  Put ice in a plastic bag.  Place a towel between your skin and the bag.  Leave the ice on for 20 minutes, 2-3 times a day  Try to avoid using the tendon (other than gentle range of motion) while the tendon is painful. Do not resume use until instructed by your health care provider. Then begin use gradually. Do not increase use to the point of pain. If pain does develop, decrease use and  continue the above measures. Gradually increase activities that do not cause discomfort until you achieve normal use.  Do exercises to make your calf muscles stronger and more flexible. Your health care provider or physical therapist can recommend exercises for you to do.  Wrap  your ankle with an elastic bandage or other wrap. This can help keep your tendon from moving too much. Your health care provider will show you how to wrap your ankle correctly.  Only take over-the-counter or prescription medicines for pain, discomfort, or fever as directed by your health care provider. SEEK MEDICAL CARE IF:   Your pain and swelling increase or pain is uncontrolled with medicines.  You develop new, unexplained symptoms or your symptoms get worse.  You are unable to move your toes or foot.  You develop warmth and swelling in your foot.  You have an unexplained temperature. MAKE SURE YOU:   Understand these instructions.  Will watch your condition.  Will get help right away if you are not doing well or get worse. Document Released: 01/02/2005 Document Revised: 01/13/2013 Document Reviewed: 11/04/2012 [facility] Patient Information 2015 [place], [place]. This information is not intended to replace advice given to you by your health care provider. Make sure you discuss any questions you have with your health care provider.    I personally performed the services described in this documentation, which was scribed in my presence. The recorded information has been reviewed and considered, and addended by me as needed.

## 2014-11-28 ENCOUNTER — Ambulatory Visit: Payer: Self-pay | Admitting: Family Medicine

## 2015-06-06 ENCOUNTER — Ambulatory Visit (INDEPENDENT_AMBULATORY_CARE_PROVIDER_SITE_OTHER): Payer: Managed Care, Other (non HMO) | Admitting: Family Medicine

## 2015-06-06 VITALS — BP 128/80 | HR 99 | Temp 99.2°F | Resp 18 | Ht 64.0 in | Wt 256.0 lb

## 2015-06-06 DIAGNOSIS — R52 Pain, unspecified: Secondary | ICD-10-CM | POA: Diagnosis not present

## 2015-06-06 DIAGNOSIS — J029 Acute pharyngitis, unspecified: Secondary | ICD-10-CM

## 2015-06-06 DIAGNOSIS — K219 Gastro-esophageal reflux disease without esophagitis: Secondary | ICD-10-CM | POA: Diagnosis not present

## 2015-06-06 DIAGNOSIS — R059 Cough, unspecified: Secondary | ICD-10-CM

## 2015-06-06 DIAGNOSIS — R05 Cough: Secondary | ICD-10-CM | POA: Diagnosis not present

## 2015-06-06 LAB — POCT RAPID STREP A (OFFICE): Rapid Strep A Screen: NEGATIVE

## 2015-06-06 LAB — POCT INFLUENZA A/B
Influenza A, POC: NEGATIVE
Influenza B, POC: NEGATIVE

## 2015-06-06 NOTE — Progress Notes (Signed)
Subjective:  By signing my name below, I, Teresa Strong, attest that this documentation has been prepared under the direction and in the presence of [name], MD.  Teresa Strong, Medical Scribe. 06/06/2015.  10:59 AM.   Patient ID: Teresa Strong, female    DOB: October 25, 1955, 60 y.o.   MRN: [id number]  Chief Complaint  Patient presents with  . Chills    x 2 days  . Generalized Body Aches    x 2 days  . Sore Throat    HPI HPI Comments: Teresa Strong is a 60 y.o. female who presents to [facility] complaining of flu-like symptoms, gradual onset 2 days ago in the morning time. Pt has associated symptoms of chills, myalgia, sore throat, cough, congestion, green-colored nasal discharge, wheezing when in a supine position, and ears fullness. She indicates that the sore throat is the most severe symptom. She has notPt is not UTD with the flu vaccine. Pt denies symptoms of fever.    Pt needs a new referral to GI due to insurance change. Pt has been referred to Dr. Bosie Clos.   Patient Active Problem List   Diagnosis Date Noted  . Bell's palsy 06/14/2013  . GERD (gastroesophageal reflux disease) 09/22/2012  . BMI 40.0-44.9, adult (HCC) 09/22/2012  . Left leg pain 09/03/2012  . Left leg swelling 09/03/2012   Past Medical History  Diagnosis Date  . Arthritis   . GERD (gastroesophageal reflux disease)   . Bell's palsy    Past Surgical History  Procedure Laterality Date  . Abdominal hysterectomy     No Known Allergies Prior to Admission medications   Medication Sig Start Date End Date Taking? Authorizing Provider  Celecoxib (CELEBREX PO) Take by mouth.   Yes Historical Provider, MD  omeprazole (PRILOSEC) 20 MG capsule Take 20 mg by mouth daily.   Yes Historical Provider, MD  meloxicam (MOBIC) 7.5 MG tablet Take 1 tablet (7.5 mg total) by mouth daily as needed for pain. Patient not taking: Reported on 06/06/2015 10/06/14   Shade Flood, MD   Social  History   Social History  . Marital Status: Single    Spouse Name: N/A  . Number of Children: N/A  . Years of Education: N/A   Occupational History  . Not on file.   Social History Main Topics  . Smoking status: Former Games developer  . Smokeless tobacco: Not on file  . Alcohol Use: 0.0 oz/week    0 Standard drinks or equivalent per week     Comment: social  . Drug Use: No  . Sexual Activity: Yes    Birth Control/ Protection: Surgical     Comment: number of sex partners in the last 12 months 1   Other Topics Concern  . Not on file   Social History Narrative    Review of Systems  Constitutional: Positive for chills. Negative for fever.  HENT: Positive for congestion, ear pain, rhinorrhea and sore throat.   Respiratory: Positive for cough and wheezing.   Musculoskeletal: Positive for myalgias.      Objective:   Physical Exam  Constitutional: She is oriented to person, place, and time. She appears well-developed and well-nourished. No distress.  HENT:  Head: Normocephalic and atraumatic.  Right Ear: Hearing, tympanic membrane, external ear and ear canal normal.  Left Ear: Hearing, tympanic membrane, external ear and ear canal normal.  Nose: Nose normal.  Mouth/Throat: Oropharynx is clear and moist. No oropharyngeal exudate.  Sinuses are  non tender.   Eyes: Conjunctivae and EOM are normal. Pupils are equal, round, and reactive to light.  Neck: Neck supple.  No lymphadenopathy.  Cardiovascular: Normal rate, regular rhythm, normal heart sounds and intact distal pulses.   No murmur heard. Pulmonary/Chest: Effort normal and breath sounds normal. No respiratory distress. She has no wheezes. She has no rhonchi.  Lymphadenopathy:    She has no cervical adenopathy.  Neurological: She is alert and oriented to person, place, and time. No cranial nerve deficit.  Skin: Skin is warm and dry. No rash noted.  Psychiatric: She has a normal mood and affect. Her behavior is normal.  Nursing  note and vitals reviewed.   Filed Vitals:   06/06/15 1001  BP: 128/80  Pulse: 99  Temp: 99.2 F (37.3 C)  TempSrc: Oral  Resp: 18  Height: 5\' 4"  (1.626 m)  Weight: 256 lb (116.121 kg)  SpO2: 96%    Results for orders placed or performed in visit on 06/06/15  POCT Influenza A/B  Result Value Ref Range   Influenza A, POC Negative Negative   Influenza B, POC Negative Negative  POCT rapid strep A  Result Value Ref Range   Rapid Strep A Screen Negative Negative       Assessment & Plan:   Teresa Strong is a 60 y.o. female Cough - Plan: POCT Influenza A/B Sore throat - Plan: POCT rapid strep A, Culture, Group A Strep Body aches - Plan: POCT Influenza A/B  - Suspected upper respiratory infection, viral illness versus false negative flu test. Beyond 48 hours, so Tamiflu was deferred. Reassuring exam. Symptomatic care discussed, Mucinex, saline nasal spray, note for work yesterday and today. RTC precautions.   Gastroesophageal reflux disease, esophagitis presence not specified - Plan: Ambulatory referral to Gastroenterology  -new referral placed due to insurance change.   Meds ordered this encounter  Medications  . Celecoxib (CELEBREX PO)    Sig: Take by mouth.   Patient Instructions  Your flu test and strep test are both negative. We will check a throat culture, but it does not look like strep throat this time. I suspect you picked up another virus or possible flu with a false negative flu test here in the office. Saline nasal spray at least 4-5 times per day, Mucinex or Mucinex DM as needed for cough, Tylenol or Motrin if needed for body aches. Drink plenty of fluids, rest, and if you are running a fever, should be fever free for 24 hours prior to return to work.  Return to the clinic or go to the nearest emergency room if any of your symptoms worsen or new symptoms occur.  Upper Respiratory Infection, Adult Most upper respiratory infections (URIs) are a viral infection of  the air passages leading to the lungs. A URI affects the nose, throat, and upper air passages. The most common type of URI is nasopharyngitis and is typically referred to as "the common cold." URIs run their course and usually go away on their own. Most of the time, a URI does not require medical attention, but sometimes a bacterial infection in the upper airways can follow a viral infection. This is called a secondary infection. Sinus and middle ear infections are common types of secondary upper respiratory infections. Bacterial pneumonia can also complicate a URI. A URI can worsen asthma and chronic obstructive pulmonary disease (COPD). Sometimes, these complications can require emergency medical care and may be life threatening.  CAUSES Almost all URIs are caused by  viruses. A virus is a type of germ and can spread from one person to another.  RISKS FACTORS You may be at risk for a URI if:   You smoke.   You have chronic heart or lung disease.  You have a weakened defense (immune) system.   You are very young or very old.   You have nasal allergies or asthma.  You work in crowded or poorly ventilated areas.  You work in health care facilities or schools. SIGNS AND SYMPTOMS  Symptoms typically develop 2-3 days after you come in contact with a cold virus. Most viral URIs last 7-10 days. However, viral URIs from the influenza virus (flu virus) can last 14-18 days and are typically more severe. Symptoms may include:   Runny or stuffy (congested) nose.   Sneezing.   Cough.   Sore throat.   Headache.   Fatigue.   Fever.   Loss of appetite.   Pain in your forehead, behind your eyes, and over your cheekbones (sinus pain).  Muscle aches.  DIAGNOSIS  Your health care provider may diagnose a URI by:  Physical exam.  Tests to check that your symptoms are not due to another condition such as:  Strep throat.  Sinusitis.  Pneumonia.  Asthma. TREATMENT  A URI  goes away on its own with time. It cannot be cured with medicines, but medicines may be prescribed or recommended to relieve symptoms. Medicines may help:  Reduce your fever.  Reduce your cough.  Relieve nasal congestion. HOME CARE INSTRUCTIONS   Take medicines only as directed by your health care provider.   Gargle warm saltwater or take cough drops to comfort your throat as directed by your health care provider.  Use a warm mist humidifier or inhale steam from a shower to increase air moisture. This may make it easier to breathe.  Drink enough fluid to keep your urine clear or pale yellow.   Eat soups and other clear broths and maintain good nutrition.   Rest as needed.   Return to work when your temperature has returned to normal or as your health care provider advises. You may need to stay home longer to avoid infecting others. You can also use a face mask and careful hand washing to prevent spread of the virus.  Increase the usage of your inhaler if you have asthma.   Do not use any tobacco products, including cigarettes, chewing tobacco, or electronic cigarettes. If you need help quitting, ask your health care provider. PREVENTION  The best way to protect yourself from getting a cold is to practice good hygiene.   Avoid oral or hand contact with people with cold symptoms.   Wash your hands often if contact occurs.  There is no clear evidence that vitamin C, vitamin E, echinacea, or exercise reduces the chance of developing a cold. However, it is always recommended to get plenty of rest, exercise, and practice good nutrition.  SEEK MEDICAL CARE IF:   You are getting worse rather than better.   Your symptoms are not controlled by medicine.   You have chills.  You have worsening shortness of breath.  You have brown or red mucus.  You have yellow or brown nasal discharge.  You have pain in your face, especially when you bend forward.  You have a  fever.  You have swollen neck glands.  You have pain while swallowing.  You have white areas in the back of your throat. SEEK IMMEDIATE MEDICAL CARE IF:  You have severe or persistent:  Headache.  Ear pain.  Sinus pain.  Chest pain.  You have chronic lung disease and any of the following:  Wheezing.  Prolonged cough.  Coughing up blood.  A change in your usual mucus.  You have a stiff neck.  You have changes in your:  Vision.  Hearing.  Thinking.  Mood. MAKE SURE YOU:   Understand these instructions.  Will watch your condition.  Will get help right away if you are not doing well or get worse.   This information is not intended to replace advice given to you by your health care provider. Make sure you discuss any questions you have with your health care provider.   Document Released: 09/18/2000 Document Revised: 08/09/2014 Document Reviewed: 06/30/2013 Elsevier Interactive Patient Education [employer]      I personally performed the services described in this documentation, which was scribed in my presence. The recorded information has been reviewed and considered, and addended by me as needed.

## 2015-06-06 NOTE — Patient Instructions (Signed)
Your flu test and strep test are both negative. We will check a throat culture, but it does not look like strep throat this time. I suspect you picked up another virus or possible flu with a false negative flu test here in the office. Saline nasal spray at least 4-5 times per day, Mucinex or Mucinex DM as needed for cough, Tylenol or Motrin if needed for body aches. Drink plenty of fluids, rest, and if you are running a fever, should be fever free for 24 hours prior to return to work.  Return to the clinic or go to the nearest emergency room if any of your symptoms worsen or new symptoms occur.  Upper Respiratory Infection, Adult Most upper respiratory infections (URIs) are a viral infection of the air passages leading to the lungs. A URI affects the nose, throat, and upper air passages. The most common type of URI is nasopharyngitis and is typically referred to as "the common cold." URIs run their course and usually go away on their own. Most of the time, a URI does not require medical attention, but sometimes a bacterial infection in the upper airways can follow a viral infection. This is called a secondary infection. Sinus and middle ear infections are common types of secondary upper respiratory infections. Bacterial pneumonia can also complicate a URI. A URI can worsen asthma and chronic obstructive pulmonary disease (COPD). Sometimes, these complications can require emergency medical care and may be life threatening.  CAUSES Almost all URIs are caused by viruses. A virus is a type of germ and can spread from one person to another.  RISKS FACTORS You may be at risk for a URI if:   You smoke.   You have chronic heart or lung disease.  You have a weakened defense (immune) system.   You are very young or very old.   You have nasal allergies or asthma.  You work in crowded or poorly ventilated areas.  You work in health care facilities or schools. SIGNS AND SYMPTOMS  Symptoms typically  develop 2-3 days after you come in contact with a cold virus. Most viral URIs last 7-10 days. However, viral URIs from the influenza virus (flu virus) can last 14-18 days and are typically more severe. Symptoms may include:   Runny or stuffy (congested) nose.   Sneezing.   Cough.   Sore throat.   Headache.   Fatigue.   Fever.   Loss of appetite.   Pain in your forehead, behind your eyes, and over your cheekbones (sinus pain).  Muscle aches.  DIAGNOSIS  Your health care provider may diagnose a URI by:  Physical exam.  Tests to check that your symptoms are not due to another condition such as:  Strep throat.  Sinusitis.  Pneumonia.  Asthma. TREATMENT  A URI goes away on its own with time. It cannot be cured with medicines, but medicines may be prescribed or recommended to relieve symptoms. Medicines may help:  Reduce your fever.  Reduce your cough.  Relieve nasal congestion. HOME CARE INSTRUCTIONS   Take medicines only as directed by your health care provider.   Gargle warm saltwater or take cough drops to comfort your throat as directed by your health care provider.  Use a warm mist humidifier or inhale steam from a shower to increase air moisture. This may make it easier to breathe.  Drink enough fluid to keep your urine clear or pale yellow.   Eat soups and other clear broths and maintain good  nutrition.   Rest as needed.   Return to work when your temperature has returned to normal or as your health care provider advises. You may need to stay home longer to avoid infecting others. You can also use a face mask and careful hand washing to prevent spread of the virus.  Increase the usage of your inhaler if you have asthma.   Do not use any tobacco products, including cigarettes, chewing tobacco, or electronic cigarettes. If you need help quitting, ask your health care provider. PREVENTION  The best way to protect yourself from getting a cold  is to practice good hygiene.   Avoid oral or hand contact with people with cold symptoms.   Wash your hands often if contact occurs.  There is no clear evidence that vitamin C, vitamin E, echinacea, or exercise reduces the chance of developing a cold. However, it is always recommended to get plenty of rest, exercise, and practice good nutrition.  SEEK MEDICAL CARE IF:   You are getting worse rather than better.   Your symptoms are not controlled by medicine.   You have chills.  You have worsening shortness of breath.  You have brown or red mucus.  You have yellow or brown nasal discharge.  You have pain in your face, especially when you bend forward.  You have a fever.  You have swollen neck glands.  You have pain while swallowing.  You have white areas in the back of your throat. SEEK IMMEDIATE MEDICAL CARE IF:   You have severe or persistent:  Headache.  Ear pain.  Sinus pain.  Chest pain.  You have chronic lung disease and any of the following:  Wheezing.  Prolonged cough.  Coughing up blood.  A change in your usual mucus.  You have a stiff neck.  You have changes in your:  Vision.  Hearing.  Thinking.  Mood. MAKE SURE YOU:   Understand these instructions.  Will watch your condition.  Will get help right away if you are not doing well or get worse.   This information is not intended to replace advice given to you by your health care provider. Make sure you discuss any questions you have with your health care provider.   Document Released: 09/18/2000 Document Revised: 08/09/2014 Document Reviewed: 06/30/2013 Elsevier Interactive Patient Education [employer]

## 2015-06-07 LAB — CULTURE, GROUP A STREP: Organism ID, Bacteria: NORMAL

## 2016-07-18 ENCOUNTER — Ambulatory Visit (INDEPENDENT_AMBULATORY_CARE_PROVIDER_SITE_OTHER): Payer: Managed Care, Other (non HMO) | Admitting: Physician Assistant

## 2016-07-18 ENCOUNTER — Ambulatory Visit (INDEPENDENT_AMBULATORY_CARE_PROVIDER_SITE_OTHER): Payer: Managed Care, Other (non HMO)

## 2016-07-18 VITALS — BP 129/83 | HR 96 | Temp 101.6°F | Resp 17 | Ht 64.0 in | Wt 265.0 lb

## 2016-07-18 DIAGNOSIS — R509 Fever, unspecified: Secondary | ICD-10-CM | POA: Diagnosis not present

## 2016-07-18 DIAGNOSIS — R05 Cough: Secondary | ICD-10-CM | POA: Diagnosis not present

## 2016-07-18 DIAGNOSIS — R059 Cough, unspecified: Secondary | ICD-10-CM

## 2016-07-18 LAB — POCT CBC
Granulocyte percent: 63 %G (ref 37–80)
HCT, POC: 38.8 % (ref 37.7–47.9)
Hemoglobin: 13.1 g/dL (ref 12.2–16.2)
Lymph, poc: 1.2 (ref 0.6–3.4)
MCH, POC: 29.6 pg (ref 27–31.2)
MCHC: 33.9 g/dL (ref 31.8–35.4)
MCV: 87.4 fL (ref 80–97)
MID (cbc): 0.3 (ref 0–0.9)
MPV: 7.9 fL (ref 0–99.8)
POC Granulocyte: 2.5 (ref 2–6.9)
POC LYMPH PERCENT: 29.8 %L (ref 10–50)
POC MID %: 7.2 %M (ref 0–12)
Platelet Count, POC: 212 10*3/uL (ref 142–424)
RBC: 4.44 M/uL (ref 4.04–5.48)
RDW, POC: 14.1 %
WBC: 4 10*3/uL — AB (ref 4.6–10.2)

## 2016-07-18 MED ORDER — PREDNISONE 20 MG PO TABS
ORAL_TABLET | ORAL | 0 refills | Status: DC
Start: 2016-07-18 — End: 2019-09-15

## 2016-07-18 MED ORDER — BENZONATATE 100 MG PO CAPS: 100.0000 mg | ORAL_CAPSULE | Freq: Three times a day (TID) | ORAL | 0 refills | Status: DC | PRN

## 2016-07-18 MED ORDER — MOXIFLOXACIN HCL 400 MG PO TABS: 400.0000 mg | ORAL_TABLET | Freq: Every day | ORAL | 0 refills | Status: DC

## 2016-07-18 NOTE — Progress Notes (Signed)
Patient ID: Teresa Strong, female    DOB: February 18, 1956, 61 y.o.   MRN: [id number]  PCP: Shade Flood, MD  Chief Complaint  Patient presents with  . Fever  . Cough    Onset 3 days  . Nasal Congestion    Subjective:   Presents for evaluation of recurrent cough.  "I am sick again! This is the third time this winter season."  Had something like this in January, no fever, resolved without treatment (illness in one week, cough in about 3 weeks).  Seen at a [facility] 06/18/2016. Cough, congestion, no fever. Sinobronchitis. Treated with Augmentin, Tessalon perles.  Each time, her symptoms have resolved completely.  3 days ago she developed sudden onset of cough, fever, chills, nasal congestion, body aches. Runny nose. Throat is dry. Cough was initially non-productive, but starting yesterday produces clear sputum. Coughs to gagging.  No flu vaccine this season. "I have never had one in my life." Had allergies as a child. Allergy testing did not identify a trigger, and she seemed to grow out of them. No worsening/more frequent GERD symptoms. Her baseline symptoms are worse at night, she uses a special pillow and is careful to avoid triggers. 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 No increased coughing following eating. No known sick contacts. No nausea or diarrhea. No new urinary symptoms. No rash.  Review of Systems As above.    Patient Active Problem List   Diagnosis Date Noted  . Bell's palsy 06/14/2013  . GERD (gastroesophageal reflux disease) 09/22/2012  . BMI 40.0-44.9, adult (HCC) 09/22/2012  . Left leg pain 09/03/2012  . Left leg swelling 09/03/2012     Prior to Admission medications   Medication Sig Start Date End Date Taking? Authorizing Provider  omeprazole (PRILOSEC) 20 MG capsule Take 20 mg by mouth daily.   Yes Historical Provider, MD     No Known Allergies     Objective:  Physical Exam  Constitutional:  She is oriented to person, place, and time. She appears well-developed and well-nourished. She is active and cooperative. No distress.  BP 129/83 (BP Location: Right Arm, Patient Position: Sitting, Cuff Size: Large)   Pulse 96   Temp (!) 101.6 F (38.7 C) (Oral)   Resp 17   Ht  (1.626 m)   Wt 265 lb (120.2 kg)   SpO2 100%   BMI 45.49 kg/m   HENT:  Head: Normocephalic and atraumatic.  Right Ear: Hearing, tympanic membrane, external ear and ear canal normal.  Left Ear: Hearing, tympanic membrane, external ear and ear canal normal.  Nose: Mucosal edema (mild) present. No rhinorrhea, nose lacerations, sinus tenderness, nasal deformity, septal deviation or nasal septal hematoma. No epistaxis.  No foreign bodies.  Mouth/Throat: Uvula is midline, oropharynx is clear and moist and mucous membranes are normal. No oral lesions. Normal dentition. No oropharyngeal exudate.  Eyes: Conjunctivae and EOM are normal. No scleral icterus.  Neck: Normal range of motion. Neck supple. No thyromegaly present.  Cardiovascular: Normal rate, regular rhythm and normal heart sounds.   Pulses:      Radial pulses are 2+ on the right side, and 2+ on the left side.  Pulmonary/Chest: Effort normal and breath sounds normal.  Lymphadenopathy:       Head (right side): No tonsillar, no preauricular, no posterior auricular and no occipital adenopathy present.       Head (left side): No tonsillar, no preauricular, no posterior auricular  and no occipital adenopathy present.    She has no cervical adenopathy.       Right: No supraclavicular adenopathy present.       Left: No supraclavicular adenopathy present.  Neurological: She is alert and oriented to person, place, and time. No sensory deficit.  Skin: Skin is warm, dry and intact. No rash noted. No cyanosis or erythema. Nails show no clubbing.  Psychiatric: She has a normal mood and affect. Her speech is normal and behavior is normal.     No results found.    Results for orders placed or performed in visit on 07/18/16  POCT CBC  Result Value Ref Range   WBC 4.0 (A) 4.6 - 10.2 K/uL   Lymph, poc 1.2 0.6 - 3.4   POC LYMPH PERCENT 29.8 10 - 50 %L   MID (cbc) 0.3 0 - 0.9   POC MID % 7.2 0 - 12 %M   POC Granulocyte 2.5 2 - 6.9   Granulocyte percent 63.0 37 - 80 %G   RBC 4.44 4.04 - 5.48 M/uL   Hemoglobin 13.1 12.2 - 16.2 g/dL   HCT, POC 16.1 09.6 - 47.9 %   MCV 87.4 80 - 97 fL   MCH, POC 29.6 27 - 31.2 pg   MCHC 33.9 31.8 - 35.4 g/dL   RDW, POC 04.5 %   Platelet Count, POC 212 142 - 424 K/uL   MPV 7.9 0 - 99.8 fL           Assessment & Plan:   Problem List Items Addressed This Visit    None    Visit Diagnoses    Cough    -  Primary   I suspect that this is sinusitis, recurrent, and this episode is secondary to influenza. Supportive care. Anticipatory guidance.   Relevant Medications   moxifloxacin (AVELOX) 400 MG tablet   benzonatate (TESSALON) 100 MG capsule   predniSONE (DELTASONE) 20 MG tablet   Other Relevant Orders   DG Chest 2 View (Completed)   Fever, unspecified fever cause       Suspect due to influenza. Not a candidate for Tamiflu given time since symptom onset. Supportive care. Anticipatory guidance.   Relevant Orders   POCT CBC (Completed)       Return if symptoms worsen or fail to improve.   Fernande Bras, PA-C Primary Care at [facility]

## 2016-07-18 NOTE — Patient Instructions (Addendum)
Get plenty of rest and drink at least 64 ounces of water daily. If your symptoms worsen/persist or recur within the next 12 weeks, I would recommend evaluation with ENT.    IF you received an x-ray today, you will receive an invoice from [facility]. Please contact [facility] at [phone] with questions or concerns regarding your invoice.   IF you received labwork today, you will receive an invoice from [facility]. Please contact [facility] at [phone] with questions or concerns regarding your invoice.   Our billing staff will not be able to assist you with questions regarding bills from these companies.  You will be contacted with the lab results as soon as they are available. The fastest way to get your results is to activate your My Chart account. Instructions are located on the last page of this paperwork. If you have not heard from [place] regarding the results in 2 weeks, please contact this office.

## 2016-09-05 ENCOUNTER — Encounter: Payer: Managed Care, Other (non HMO) | Admitting: Family Medicine

## 2017-03-17 ENCOUNTER — Ambulatory Visit: Payer: Managed Care, Other (non HMO) | Admitting: Family Medicine

## 2017-03-21 ENCOUNTER — Encounter: Payer: Self-pay | Admitting: Family Medicine

## 2017-03-21 ENCOUNTER — Ambulatory Visit: Payer: Managed Care, Other (non HMO) | Admitting: Family Medicine

## 2017-03-21 ENCOUNTER — Other Ambulatory Visit: Payer: Self-pay

## 2017-03-21 VITALS — BP 122/74 | HR 72 | Temp 98.7°F | Resp 18 | Ht 64.0 in | Wt 240.0 lb

## 2017-03-21 DIAGNOSIS — L918 Other hypertrophic disorders of the skin: Secondary | ICD-10-CM | POA: Diagnosis not present

## 2017-03-21 NOTE — Progress Notes (Signed)
Subjective:  By signing my name below, I, Teresa Strong, attest that this documentation has been prepared under the direction and in the presence of Teresa FloodJeffrey R Keosha Rossa, MD Electronically Signed: Charline BillsEssence Strong, ED Scribe 03/21/2017 at 8:46 AM.   Patient ID: Teresa Strong, female    DOB: 11/03/1955, 61 y.o.   MRN: [id number]  Chief Complaint  Patient presents with  . Nevus    back on left thigh wants it to be removed    HPI Teresa Strong Crable is a 61 y.o. female who presents to [facility] at [facility] complaining of a gradually enlarging, nontender skin tag to the back of the L thigh first noticed 1 year ago. Pt states the area is annoying and occasionally catches on her jeans. Denies change in color or appearance. She has had previous skin tags removed.  Patient Active Problem List   Diagnosis Date Noted  . Bell's palsy 06/14/2013  . GERD (gastroesophageal reflux disease) 09/22/2012  . BMI 40.0-44.9, adult (HCC) 09/22/2012  . Left leg pain 09/03/2012  . Left leg swelling 09/03/2012   Past Medical History:  Diagnosis Date  . Arthritis   . Bell's palsy   . GERD (gastroesophageal reflux disease)    Past Surgical History:  Procedure Laterality Date  . ABDOMINAL HYSTERECTOMY     No Known Allergies Prior to Admission medications   Medication Sig Start Date End Date Taking? Authorizing Provider  benzonatate (TESSALON) 100 MG capsule Take 1-2 capsules (100-200 mg total) by mouth 3 (three) times daily as needed for cough. 07/18/16   Porfirio OarJeffery, Chelle, PA-C  moxifloxacin (AVELOX) 400 MG tablet Take 1 tablet (400 mg total) by mouth daily. 07/18/16   Porfirio OarJeffery, Chelle, PA-C  omeprazole (PRILOSEC) 20 MG capsule Take 20 mg by mouth daily.    [provider]  predniSONE (DELTASONE) 20 MG tablet Take 3 PO QAM x3days, 2 PO QAM x3days, 1 PO QAM x3days 07/18/16   Porfirio OarJeffery, Chelle, PA-C   Social History   Socioeconomic History  . Marital status: Single    Spouse name: Not on file  . Number of  children: Not on file  . Years of education: Not on file  . Highest education level: Not on file  Social Needs  . Financial resource strain: Not on file  . Food insecurity - worry: Not on file  . Food insecurity - inability: Not on file  . Transportation needs - medical: Not on file  . Transportation needs - non-medical: Not on file  Occupational History  . Not on file  Tobacco Use  . Smoking status: Former Games developermoker  . Smokeless tobacco: Never Used  Substance and Sexual Activity  . Alcohol use: Yes    Alcohol/week: 0.0 oz    Comment: social  . Drug use: No  . Sexual activity: Yes    Birth control/protection: Surgical    Comment: number of sex partners in the last 12 months 1  Other Topics Concern  . Not on file  Social History Narrative  . Not on file   Review of Systems  Skin: Negative for color change.       +Skin tag to L thigh      Objective:   Physical Exam  Constitutional: She is oriented to person, place, and time. She appears well-developed and well-nourished. No distress.  HENT:  Head: Normocephalic and atraumatic.  Eyes: Conjunctivae and EOM are normal.  Neck: Neck supple. No tracheal deviation present.  Cardiovascular: Normal rate.  Pulmonary/Chest: Effort normal. No  respiratory distress.  Musculoskeletal: Normal range of motion.  Neurological: She is alert and oriented to person, place, and time.  Skin: Skin is warm and dry.  L thigh: 1.3 cm x 1 cm elevated skin tag flesh colored. No hyperpigmentation with pedunculated base. No surrounding erythema. The base is ~644mm across.  Psychiatric: She has a normal mood and affect. Her behavior is normal.  Nursing note and vitals reviewed.  Vitals:   03/21/17 0840  BP: 122/74  Pulse: 72  Resp: 18  Temp: 98.7 F (37.1 C)  TempSrc: Oral  SpO2: 98%  Weight: 240 lb (108.9 kg)  Height: 5\' 4"  (1.626 m)      Assessment & Plan:   Teresa Strong Korver is a 61 y.o. female Acquired skin tag - Plan: Dermatology  pathology  Suspected benign skin tag, removed per procedure note after options discussed. Chose to remove due to persistent irritation and catching that area on clothing or when sitting. Send for pathology, RTC precautions/wound care discussed  No orders of the defined types were placed in this encounter.  Patient Instructions   I will send the suspected skin tag to pathology to make sure it is only a skin tag. Keep wound clean, covered. Return to the clinic or go to the nearest emergency room if any of your symptoms worsen or new symptoms occur.   Skin Tag, Adult A skin tag (acrochordon) is a soft, extra growth of skin. Most skin tags are flesh-colored and rarely bigger than a pencil eraser. They commonly form near areas where there are folds in the skin, such as the armpit or groin. Skin tags are not dangerous, and they do not spread from person to person (are not contagious). You may have one skin tag or several. Skin tags do not require treatment. However, your health care provider may recommend removal of a skin tag if it:  Gets irritated from clothing.  Bleeds.  Is visible and unsightly.  Your health care provider can remove skin tags with a simple surgical procedure or a procedure that involves freezing the skin tag. Follow these instructions at home:  Watch for any changes in your skin tag. A normal skin tag does not require any other special care at home.  Take over-the-counter and prescription medicines only as told by your health care provider.  Keep all follow-up visits as told by your health care provider. This is important. Contact a health care provider if:  You have a skin tag that: ? Becomes painful. ? Changes color. ? Bleeds. ? Swells.  You develop more skin tags. This information is not intended to replace advice given to you by your health care provider. Make sure you discuss any questions you have with your health care provider. Document Released: 04/09/2015  Document Revised: 11/19/2015 Document Reviewed: 04/09/2015 Elsevier Interactive Patient Education  2018 [employer]     IF you received an x-ray today, you will receive an invoice from [facility]. Please contact [facility] at [phone] with questions or concerns regarding your invoice.   IF you received labwork today, you will receive an invoice from [facility]. Please contact [facility] at [phone] with questions or concerns regarding your invoice.   Our billing staff will not be able to assist you with questions regarding Strong from these companies.  You will be contacted with the lab results as soon as they are available. The fastest way to get your results is to activate your My Chart account. Instructions are located on the last  page of this paperwork. If you have not heard from us regarding the results in 2 weeks, please contact this office.     

## 2017-03-21 NOTE — Patient Instructions (Addendum)
I will send the suspected skin tag to pathology to make sure it is only a skin tag. Keep wound clean, covered. Return to the clinic or go to the nearest emergency room if any of your symptoms worsen or new symptoms occur.   Skin Tag, Adult A skin tag (acrochordon) is a soft, extra growth of skin. Most skin tags are flesh-colored and rarely bigger than a pencil eraser. They commonly form near areas where there are folds in the skin, such as the armpit or groin. Skin tags are not dangerous, and they do not spread from person to person (are not contagious). You may have one skin tag or several. Skin tags do not require treatment. However, your health care provider may recommend removal of a skin tag if it:  Gets irritated from clothing.  Bleeds.  Is visible and unsightly.  Your health care provider can remove skin tags with a simple surgical procedure or a procedure that involves freezing the skin tag. Follow these instructions at home:  Watch for any changes in your skin tag. A normal skin tag does not require any other special care at home.  Take over-the-counter and prescription medicines only as told by your health care provider.  Keep all follow-up visits as told by your health care provider. This is important. Contact a health care provider if:  You have a skin tag that: ? Becomes painful. ? Changes color. ? Bleeds. ? Swells.  You develop more skin tags. This information is not intended to replace advice given to you by your health care provider. Make sure you discuss any questions you have with your health care provider. Document Released: 04/09/2015 Document Revised: 11/19/2015 Document Reviewed: 04/09/2015 Elsevier Interactive Patient Education  2018 [employer]     IF you received an x-ray today, you will receive an invoice from [facility]. Please contact [facility] at [phone] with questions or concerns regarding your invoice.   IF you  received labwork today, you will receive an invoice from [facility]. Please contact [facility] at [phone] with questions or concerns regarding your invoice.   Our billing staff will not be able to assist you with questions regarding bills from these companies.  You will be contacted with the lab results as soon as they are available. The fastest way to get your results is to activate your My Chart account. Instructions are located on the last page of this paperwork. If you have not heard from us regarding the results in 2 weeks, please contact this office.

## 2017-03-24 ENCOUNTER — Encounter: Payer: Managed Care, Other (non HMO) | Admitting: Family Medicine

## 2017-04-26 ENCOUNTER — Encounter: Payer: Self-pay | Admitting: Radiology

## 2017-07-11 ENCOUNTER — Telehealth: Payer: Self-pay | Admitting: Family Medicine

## 2017-07-11 DIAGNOSIS — M1711 Unilateral primary osteoarthritis, right knee: Secondary | ICD-10-CM

## 2017-07-11 NOTE — Telephone Encounter (Signed)
Copied from CRM [id number]. Topic: Referral - Request >> Jul 11, 2017  2:44 PM Rudi CocoLathan, Pascal Stiggers M, NT wrote: Reason for CRM: pt. Calling to request a referral for an orthopedic specialist for her knee.

## 2017-07-11 NOTE — Telephone Encounter (Signed)
Please advise. Seen over a year ago for same issue.

## 2017-07-13 NOTE — Telephone Encounter (Signed)
More information please.  Last referred in 2016 when she needed a new orthopedist.  If it is for her bilateral knee pain, right worse than left, I can refer for same reason.  If she is having new symptoms, may need to be seen to further determine appropriate workup.  Thanks

## 2017-07-14 NOTE — Telephone Encounter (Signed)
Copied from CRM [id number]. Topic: Quick Communication - Office Called Patient >> Jul 14, 2017  1:01 PM Arlyss Gandyichardson, Taren N, NT wrote: Pt calling back and states it is for the same problem but the dr she was going to cannot see her anymore because they want her to have surgery and she can not take off for surgery. She would like to see a new physician and would like that referral without having to come in and be seen. Requesting to be referred to [facility].

## 2017-07-14 NOTE — Telephone Encounter (Signed)
Phone call to patient, unable to reach.  When patient calls back, please relay message below.

## 2017-07-14 NOTE — Telephone Encounter (Signed)
Referral pended for provider signature.

## 2017-07-15 NOTE — Telephone Encounter (Signed)
Referral placed.

## 2018-08-14 ENCOUNTER — Ambulatory Visit: Payer: Managed Care, Other (non HMO) | Admitting: Family Medicine

## 2019-08-16 ENCOUNTER — Ambulatory Visit: Payer: Managed Care, Other (non HMO)

## 2019-09-10 ENCOUNTER — Ambulatory Visit (INDEPENDENT_AMBULATORY_CARE_PROVIDER_SITE_OTHER): Payer: Managed Care, Other (non HMO) | Admitting: Family Medicine

## 2019-09-10 ENCOUNTER — Other Ambulatory Visit: Payer: Self-pay

## 2019-09-10 DIAGNOSIS — Z0000 Encounter for general adult medical examination without abnormal findings: Secondary | ICD-10-CM

## 2019-09-10 NOTE — Progress Notes (Signed)
Lab only visit, patient not examined. 

## 2019-09-10 NOTE — Patient Instructions (Signed)
° ° ° °  If you have lab work done today you will be contacted with your lab results within the next 2 weeks.  If you have not heard from us then please contact us. The fastest way to get your results is to register for My Chart. ° ° °IF you received an x-ray today, you will receive an invoice from [facility]. Please contact [facility] at [phone] with questions or concerns regarding your invoice.  ° °IF you received labwork today, you will receive an invoice from [facility]. Please contact [facility] at [phone] with questions or concerns regarding your invoice.  ° °Our billing staff will not be able to assist you with questions regarding bills from these companies. ° °You will be contacted with the lab results as soon as they are available. The fastest way to get your results is to activate your My Chart account. Instructions are located on the last page of this paperwork. If you have not heard from us regarding the results in 2 weeks, please contact this office. °  ° ° ° °

## 2019-09-10 NOTE — Progress Notes (Signed)
Pt having physical labs done today for physical with greene on 09/15/2019. Pt informed would discuss these at her appt.

## 2019-09-11 LAB — CMP14+EGFR
ALT: 9 IU/L (ref 0–32)
AST: 15 IU/L (ref 0–40)
Albumin/Globulin Ratio: 1.8 (ref 1.2–2.2)
Albumin: 4.7 g/dL (ref 3.8–4.8)
Alkaline Phosphatase: 95 IU/L (ref 48–121)
BUN/Creatinine Ratio: 15 (ref 12–28)
BUN: 13 mg/dL (ref 8–27)
Bilirubin Total: 0.4 mg/dL (ref 0.0–1.2)
CO2: 21 mmol/L (ref 20–29)
Calcium: 9.8 mg/dL (ref 8.7–10.3)
Chloride: 103 mmol/L (ref 96–106)
Creatinine, Ser: 0.89 mg/dL (ref 0.57–1.00)
GFR calc Af Amer: 81 mL/min/{1.73_m2} (ref >59)
GFR calc non Af Amer: 70 mL/min/{1.73_m2} (ref >59)
Globulin, Total: 2.6 g/dL (ref 1.5–4.5)
Glucose: 102 mg/dL — ABNORMAL HIGH (ref 65–99)
Potassium: 4.8 mmol/L (ref 3.5–5.2)
Sodium: 139 mmol/L (ref 134–144)
Total Protein: 7.3 g/dL (ref 6.0–8.5)

## 2019-09-11 LAB — CBC WITH DIFFERENTIAL/PLATELET
Basophils Absolute: 0 10*3/uL (ref 0.0–0.2)
Basos: 1 %
EOS (ABSOLUTE): 0.1 10*3/uL (ref 0.0–0.4)
Eos: 3 %
Hematocrit: 40.2 % (ref 34.0–46.6)
Hemoglobin: 13.3 g/dL (ref 11.1–15.9)
Immature Grans (Abs): 0 10*3/uL (ref 0.0–0.1)
Immature Granulocytes: 0 %
Lymphocytes Absolute: 1.6 10*3/uL (ref 0.7–3.1)
Lymphs: 35 %
MCH: 29.6 pg (ref 26.6–33.0)
MCHC: 33.1 g/dL (ref 31.5–35.7)
MCV: 90 fL (ref 79–97)
Monocytes Absolute: 0.4 10*3/uL (ref 0.1–0.9)
Monocytes: 8 %
Neutrophils Absolute: 2.5 10*3/uL (ref 1.4–7.0)
Neutrophils: 53 %
Platelets: 259 10*3/uL (ref 150–450)
RBC: 4.49 x10E6/uL (ref 3.77–5.28)
RDW: 13 % (ref 11.7–15.4)
WBC: 4.6 10*3/uL (ref 3.4–10.8)

## 2019-09-11 LAB — LIPID PANEL
Chol/HDL Ratio: 4.2 ratio (ref 0.0–4.4)
Cholesterol, Total: 219 mg/dL — ABNORMAL HIGH (ref 100–199)
HDL: 52 mg/dL (ref >39)
LDL Chol Calc (NIH): 148 mg/dL — ABNORMAL HIGH (ref 0–99)
Triglycerides: 105 mg/dL (ref 0–149)
VLDL Cholesterol Cal: 19 mg/dL (ref 5–40)

## 2019-09-11 LAB — HEMOGLOBIN A1C
Est. average glucose Bld gHb Est-mCnc: 120 mg/dL
Hgb A1c MFr Bld: 5.8 % — ABNORMAL HIGH (ref 4.8–5.6)

## 2019-09-15 ENCOUNTER — Ambulatory Visit: Payer: Managed Care, Other (non HMO) | Admitting: Family Medicine

## 2019-09-15 ENCOUNTER — Other Ambulatory Visit: Payer: Self-pay

## 2019-09-15 ENCOUNTER — Encounter: Payer: Self-pay | Admitting: Family Medicine

## 2019-09-15 VITALS — BP 119/80 | HR 78 | Temp 98.0°F | Ht 63.0 in | Wt 257.0 lb

## 2019-09-15 DIAGNOSIS — Z87898 Personal history of other specified conditions: Secondary | ICD-10-CM

## 2019-09-15 DIAGNOSIS — Z1211 Encounter for screening for malignant neoplasm of colon: Secondary | ICD-10-CM | POA: Diagnosis not present

## 2019-09-15 DIAGNOSIS — Z0000 Encounter for general adult medical examination without abnormal findings: Secondary | ICD-10-CM

## 2019-09-15 DIAGNOSIS — Z0001 Encounter for general adult medical examination with abnormal findings: Secondary | ICD-10-CM | POA: Diagnosis not present

## 2019-09-15 DIAGNOSIS — Z5181 Encounter for therapeutic drug level monitoring: Secondary | ICD-10-CM

## 2019-09-15 DIAGNOSIS — B351 Tinea unguium: Secondary | ICD-10-CM

## 2019-09-15 DIAGNOSIS — Z9181 History of falling: Secondary | ICD-10-CM | POA: Diagnosis not present

## 2019-09-15 MED ORDER — TERBINAFINE HCL 250 MG PO TABS: 250.0000 mg | ORAL_TABLET | Freq: Every day | ORAL | 0 refills | Status: DC

## 2019-09-15 NOTE — Progress Notes (Signed)
Subjective:  Patient ID: Teresa Strong, female    DOB: January 28, 1956  Age: 64 y.o. MRN: [id number]  CC:  Chief Complaint  Patient presents with  . Annual Exam    pt reports as far as her general health she feels fine.  . fungas    pt reports she has some fungus on the big toe on her R foot. pt reports no constant pain in the area. pt hasn't triend any over the counter medication for it. fungus first showed up 3-4 years ago.    HPI Teresa Strong presents for   Presents for annual physical exam.  Only concern is toenail fungus on her right foot.  Fasting labs 5 days ago. 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   Obesity/prediabetes: Body mass index is 45.53 kg/m. Some sweet tea or coffee.  Does enjoy desserts.  Weight watchers in past  - some gain but maintained.  Lab Results  Component Value Date   HGBA1C 5.8 (H) 09/10/2019   Wt Readings from Last 3 Encounters:  09/15/19 257 lb (116.6 kg)  03/21/17 240 lb (108.9 kg)  07/18/16 265 lb (120.2 kg)   GERD: Omeprazole daily.  Unable to skip day. Rare breakthrough sx's. Avoiding triggers.   Hyperlipidemia: Elevated readings with fasting labs recently. No FH of heart disease.  The 10-year ASCVD risk score Denman George DC Montez Hageman., et al., 2013) is: 3.5%   Values used to calculate the score:     Age: 23 years     Sex: Female     Is Non-Hispanic African American: No     Diabetic: No     Tobacco smoker: No     Systolic Blood Pressure: 119 mmHg     Is BP treated: No     HDL Cholesterol: 52 mg/dL     Total Cholesterol: 219 mg/dL  Lab Results  Component Value Date   CHOL 219 (H) 09/10/2019   HDL 52 09/10/2019   LDLCALC 148 (H) 09/10/2019   TRIG 105 09/10/2019   CHOLHDL 4.2 09/10/2019   Lab Results  Component Value Date   ALT 9 09/10/2019   AST 15 09/10/2019   ALKPHOS 95 09/10/2019   BILITOT 0.4 09/10/2019   Right great toenail discoloration: R great toe.  Discolored for years. Has kept covered, but worse this year.  Thick/discolored  And now some discoloration in other toes.  No otc treatments.  No pain. No prior lamisil.   Fall  October 2020.  Tripped over curb - hit face. Small wound. No LOC.  1 night later. Had dizzy episode, woke up with this.  Had continued dizzy spells that persisted. No headaches.  No medical attention/eval.  Dizzy spells improved slowly and resolved now past month. None in past month.   Cancer screening Due for mammogram, Pap testing, colonoscopy, hep C screen (had in 90's when in hospital).  Colon: 2011 colonoscopy. Few benign polyps, recheck 10 yrs. Requests Cologuard.  Mammogram, pap - appt with OBGYN next month.   Will be having shingles vaccine at pharmacy.   Immunization History  Administered Date(s) Administered  . PFIZER SARS-COV-2 Vaccination 08/18/2019, 09/08/2019  . Tdap 08/24/2012   Depression screen [facility] 2/9 09/15/2019 03/21/2017 07/18/2016  Decreased Interest 0 0 0  Down, Depressed, Hopeless 0 0 0  PHQ - 2 Score 0 0 0    No exam data present Contact leneses. appt with optho in FEb.   Dental: every 6 months.   Exercise.  walking dog. per day, walking  with craft shows. R knee limiting at times.   History Patient Active Problem List   Diagnosis Date Noted  . Bell's palsy 06/14/2013  . GERD (gastroesophageal reflux disease) 09/22/2012  . BMI 40.0-44.9, adult (HCC) 09/22/2012  . Left leg pain 09/03/2012  . Left leg swelling 09/03/2012   Past Medical History:  Diagnosis Date  . Arthritis   . Bell's palsy   . GERD (gastroesophageal reflux disease)    Past Surgical History:  Procedure Laterality Date  . ABDOMINAL HYSTERECTOMY     No Known Allergies Prior to Admission medications   Medication Sig Start Date End Date Taking? Authorizing Provider  omeprazole (PRILOSEC) 20 MG capsule Take 20 mg by mouth daily.   Yes [provider]  benzonatate (TESSALON) 100 MG capsule Take 1-2 capsules (100-200 mg total) by mouth 3 (three) times  daily as needed for cough. Patient not taking: Reported on 03/21/2017 07/18/16   Porfirio Oar, PA  moxifloxacin (AVELOX) 400 MG tablet Take 1 tablet (400 mg total) by mouth daily. Patient not taking: Reported on 03/21/2017 07/18/16   Porfirio Oar, PA  predniSONE (DELTASONE) 20 MG tablet Take 3 PO QAM x3days, 2 PO QAM x3days, 1 PO QAM x3days Patient not taking: Reported on 03/21/2017 07/18/16   Porfirio Oar, PA   Social History   Socioeconomic History  . Marital status: Single    Spouse name: Not on file  . Number of children: Not on file  . Years of education: Not on file  . Highest education level: Not on file  Occupational History  . Not on file  Tobacco Use  . Smoking status: Former Games developer  . Smokeless tobacco: Never Used  Vaping Use  . Vaping Use: Never used  Substance and Sexual Activity  . Alcohol use: Not Currently    Alcohol/week: 0.0 standard drinks    Comment: social  . Drug use: No  . Sexual activity: Yes    Birth control/protection: Surgical    Comment: number of sex partners in the last 12 months 1  Other Topics Concern  . Not on file  Social History Narrative  . Not on file   Social Determinants of Health   Financial Resource Strain:   . Difficulty of Paying Living Expenses:   Food Insecurity:   . Worried About Programme researcher, broadcasting/film/video in the Last Year:   . Barista in the Last Year:   Transportation Needs:   . Freight forwarder (Medical):   Marland Kitchen Lack of Transportation (Non-Medical):   Physical Activity:   . Days of Exercise per Week:   . Minutes of Exercise per Session:   Stress:   . Feeling of Stress :   Social Connections:   . Frequency of Communication with Friends and Family:   . Frequency of Social Gatherings with Friends and Family:   . Attends Religious Services:   . Active Member of Clubs or Organizations:   . Attends Banker Meetings:   Marland Kitchen Marital Status:   Intimate Partner Violence:   . Fear of Current or  Ex-Partner:   . Emotionally Abused:   Marland Kitchen Physically Abused:   . Sexually Abused:     Review of Systems 13 point review of systems per patient health survey noted.  Negative other than as indicated above or in HPI.    Objective:   Vitals:   09/15/19 1351  BP: 119/80  Pulse: 78  Temp: 98 F (36.7 C)  TempSrc: Temporal  SpO2: 98%  Weight: 257 lb (116.6 kg)  Height:  (1.6 m)     Physical Exam Vitals reviewed.  Constitutional:      Appearance: She is well-developed. She is obese.  HENT:     Head: Normocephalic and atraumatic.     Right Ear: External ear normal.     Left Ear: External ear normal.  Eyes:     Conjunctiva/sclera: Conjunctivae normal.     Pupils: Pupils are equal, round, and reactive to light.  Neck:     Thyroid: No thyromegaly.  Cardiovascular:     Rate and Rhythm: Normal rate and regular rhythm.     Heart sounds: Normal heart sounds. No murmur heard.   Pulmonary:     Effort: Pulmonary effort is normal. No respiratory distress.     Breath sounds: Normal breath sounds. No wheezing.  Abdominal:     General: Bowel sounds are normal.     Palpations: Abdomen is soft.     Tenderness: There is no abdominal tenderness.  Musculoskeletal:        General: No tenderness. Normal range of motion.     Cervical back: Normal range of motion and neck supple.  Lymphadenopathy:     Cervical: No cervical adenopathy.  Skin:    General: Skin is warm and dry.     Findings: No rash.     Comments: Thickened discolored toenails primarily right great toenail with pronounced thickening distally.  Also involving right second through fourth toenails, left first and fourth toenails.  No surrounding skin irritation/erythema  Neurological:     General: No focal deficit present.     Mental Status: She is alert and oriented to person, place, and time.     GCS: GCS eye subscore is 4. GCS verbal subscore is 5. GCS motor subscore is 6.     Cranial Nerves: No cranial nerve deficit,  dysarthria or facial asymmetry.     Sensory: No sensory deficit.     Motor: No weakness, tremor or pronator drift.     Coordination: Romberg sign negative.     Gait: Gait is intact.  Psychiatric:        Behavior: Behavior normal.        Thought Content: Thought content normal.        Assessment & Plan:  Teresa Strong is a 64 y.o. female . Annual physical exam  - -anticipatory guidance as below in AVS, screening labs above. Health maintenance items as above in HPI discussed/recommended as applicable.   Special screening for malignant neoplasms, colon - Plan: Cologuard  - Screening options with colonoscopy versus Cologuard discussed. Discussed timing of repeat testing intervals if normal, as well as potential need for diagnostic Colonoscopy if positive Cologuard. Understanding expressed, and chose Cologuard.   Onychomycosis - Plan: terbinafine (LAMISIL) 250 MG tablet  -Topical versus systemic meds discussed.  Chose terbinafine.  Recent LFTs okay, recheck levels in 6 weeks, minimum 12 weeks treatment, potentially may need few months and potential for incomplete treatment discussed. ' Medication monitoring encounter - Plan: terbinafine (LAMISIL) 250 MG tablet, Hepatic Function Panel, CANCELED: Hepatic Function Panel  -Lab only visit ordered for Lamisil monitoring  History of fall History of dizziness  -Had some persistent dizziness after previous fall and head contusion.  Suspect she did have concussion.  Dizziness has now resolved and no residual symptoms.  Nonfocal neurologic exam.  Deferred imaging at this time with RTC precautions if recurrence of symptoms.  Meds ordered this encounter  Medications  . terbinafine (LAMISIL) 250 MG tablet    Sig: Take 1 tablet (250 mg total) by mouth daily.    Dispense:  90 tablet    Refill:  0   Patient Instructions    Lamisil once per day for toenail fungus.  6-week lab visit for liver test.  If not resolved at end of medication, schedule  follow-up visit to discuss further meds.  Recheck with me in 6 months.  Glad to hear you are better, but previous dizziness could have been related to concussion or head injury.  If any return of dizziness, new headaches, or other new symptoms be seen right away.  Watch diet with portions, avoidance of sweetened beverages and desserts as much as possible or smaller portions if possible to help with prediabetes.  Can recheck levels in 6 months.  Thank you for coming today and take care  Keeping You Healthy  Get These Tests  Blood Pressure- Have your blood pressure checked by your healthcare provider at least once a year.  Normal blood pressure is 120/80.  Weight- Have your body mass index (BMI) calculated to screen for obesity.  BMI is a measure of body fat based on height and weight.  You can calculate your own BMI at https://www.west-esparza.com/  Cholesterol- Have your cholesterol checked every year.  Diabetes- Have your blood sugar checked every year if you have high blood pressure, high cholesterol, a family history of diabetes or if you are overweight.  Pap Test - Have a pap test every 1 to 5 years if you have been sexually active.  If you are older than 65 and recent pap tests have been normal you may not need additional pap tests.  In addition, if you have had a hysterectomy  for benign disease additional pap tests are not necessary.  Mammogram-Yearly mammograms are essential for early detection of breast cancer  Screening for Colon Cancer- Colonoscopy starting at age 82. Screening may begin sooner depending on your family history and other health conditions.  Follow up colonoscopy as directed by your Gastroenterologist.  Screening for Osteoporosis- Screening begins at age 51 with bone density scanning, sooner if you are at higher risk for developing Osteoporosis.  Get these medicines  Calcium with Vitamin D- Your body requires 1200-1500 mg of Calcium a day and [phone] IU of Vitamin D  a day.  You can only absorb 500 mg of Calcium at a time therefore Calcium must be taken in 2 or 3 separate doses throughout the day.  Hormones- Hormone therapy has been associated with increased risk for certain cancers and heart disease.  Talk to your healthcare provider about if you need relief from menopausal symptoms.  Aspirin- Ask your healthcare provider about taking Aspirin to prevent Heart Disease and Stroke.  Get these Immuniztions  Flu shot- Every fall  Pneumonia shot- Once after the age of 67; if you are younger ask your healthcare provider if you need a pneumonia shot.  Tetanus- Every ten years.  Zostavax- Once after the age of 75 to prevent shingles.  Take these steps  Don't smoke- Your healthcare provider can help you quit. For tips on how to quit, ask your healthcare provider or go to www.smokefree.gov or call 1-800 QUIT-NOW.  Be physically active- Exercise 5 days a week for a minimum of 30 minutes.  If you are not already physically active, start slow and gradually work up to 30 minutes of moderate physical activity.  Try walking, dancing, bike riding, swimming,  etc.  Eat a healthy diet- Eat a variety of healthy foods such as fruits, vegetables, whole grains, low fat milk, low fat cheeses, yogurt, lean meats, chicken, fish, eggs, dried beans, tofu, etc.  For more information go to www.thenutritionsource.org  Dental visit- Brush and floss teeth twice daily; visit your dentist twice a year.  Eye exam- Visit your Optometrist or Ophthalmologist yearly.  Drink alcohol in moderation- Limit alcohol intake to one drink or less a day.  Never drink and drive.  Depression- Your emotional health is as important as your physical health.  If you're feeling down or losing interest in things you normally enjoy, please talk to your healthcare provider.  Seat Belts- can save your life; always wear one  Smoke/Carbon Monoxide detectors- These detectors need to be installed on the  appropriate level of your home.  Replace batteries at least once a year.  Violence- If anyone is threatening or hurting you, please tell your healthcare provider.  Living Will/ Health care power of attorney- Discuss with your healthcare provider and family.    Fungal Nail Infection A fungal nail infection is a common infection of the toenails or fingernails. This condition affects toenails more often than fingernails. It often affects the great, or big, toes. More than one nail may be infected. The condition can be passed from person to person (is contagious). What are the causes? This condition is caused by a fungus. Several types of fungi can cause the infection. These fungi are common in moist and warm areas. If your hands or feet come into contact with the fungus, it may get into a crack in your fingernail or toenail and cause the infection. What increases the risk? The following factors may make you more likely to develop this condition:  Being female.  Being of older age.  Living with someone who has the fungus.  Walking barefoot in areas where the fungus thrives, such as showers or locker rooms.  Wearing shoes and socks that cause your feet to sweat.  Having a nail injury or a recent nail surgery.  Having certain medical conditions, such as: ? Athlete's foot. ? Diabetes. ? Psoriasis. ? Poor circulation. ? A weak body defense system (immune system). What are the signs or symptoms? Symptoms of this condition include:  A pale spot on the nail.  Thickening of the nail.  A nail that becomes yellow or brown.  A brittle or ragged nail edge.  A crumbling nail.  A nail that has lifted away from the nail bed. How is this diagnosed? This condition is diagnosed with a physical exam. Your health care provider may take a scraping or clipping from your nail to test for the fungus. How is this treated? Treatment is not needed for mild infections. If you have significant nail  changes, treatment may include:  Antifungal medicines taken by mouth (orally). You may need to take the medicine for several weeks or several months, and you may not see the results for a long time. These medicines can cause side effects. Ask your health care provider what problems to watch for.  Antifungal nail polish or nail cream. These may be used along with oral antifungal medicines.  Laser treatment of the nail.  Surgery to remove the nail. This may be needed for the most severe infections. It can take a long time, usually up to a year, for the infection to go away. The infection may also come back. Follow these instructions at home: Medicines  Take  or apply over-the-counter and prescription medicines only as told by your health care provider.  Ask your health care provider about using over-the-counter mentholated ointment on your nails. Nail care  Trim your nails often.  Wash and dry your hands and feet every day.  Keep your feet dry: ? Wear absorbent socks, and change your socks frequently. ? Wear shoes that allow air to circulate, such as sandals or canvas tennis shoes. Throw out old shoes.  Do not use artificial nails.  If you go to a nail salon, make sure you choose one that uses clean instruments.  Use antifungal foot powder on your feet and in your shoes. General instructions  Do not share personal items, such as towels or nail clippers.  Do not walk barefoot in shower rooms or locker rooms.  Wear rubber gloves if you are working with your hands in wet areas.  Keep all follow-up visits as told by your health care provider. This is important. Contact a health care provider if: Your infection is not getting better or it is getting worse after several months. Summary  A fungal nail infection is a common infection of the toenails or fingernails.  Treatment is not needed for mild infections. If you have significant nail changes, treatment may include taking  medicine orally and applying medicine to your nails.  It can take a long time, usually up to a year, for the infection to go away. The infection may also come back.  Take or apply over-the-counter and prescription medicines only as told by your health care provider.  Follow instructions for taking care of your nails to help prevent infection from coming back or spreading. This information is not intended to replace advice given to you by your health care provider. Make sure you discuss any questions you have with your health care provider. Document Revised: 07/16/2018 Document Reviewed: 08/29/2017 Elsevier Patient Education  2020 [employer]  Prediabetes Prediabetes is the condition of having a blood sugar (blood glucose) level that is higher than it should be, but not high enough for you to be diagnosed with type 2 diabetes. Having prediabetes puts you at risk for developing type 2 diabetes (type 2 diabetes mellitus). Prediabetes may be called impaired glucose tolerance or impaired fasting glucose. Prediabetes usually does not cause symptoms. Your health care provider can diagnose this condition with blood tests. You may be tested for prediabetes if you are overweight and if you have at least one other risk factor for prediabetes. What is blood glucose, and how is it measured? Blood glucose refers to the amount of glucose in your bloodstream. Glucose comes from eating foods that contain sugars and starches (carbohydrates), which the body breaks down into glucose. Your blood glucose level may be measured in mg/dL (milligrams per deciliter) or mmol/L (millimoles per liter). Your blood glucose may be checked with one or more of the following blood tests:  A fasting blood glucose (FBG) test. You will not be allowed to eat (you will fast) for 8 hours or longer before a blood sample is taken. ? A normal range for FBG is 70-100 mg/dl (1.6-1.03.9-5.6 mmol/L).  An A1c (hemoglobin A1c) blood test. This test  provides information about blood glucose control over the previous 2?3months.  An oral glucose tolerance test (OGTT). This test measures your blood glucose at two times: ? After fasting. This is your baseline level. ? Two hours after you drink a beverage that contains glucose. You may be diagnosed with prediabetes:  If  your FBG is 100?125 mg/dL (5.6-6.9 mmol/L).  If your A1c level is 5.7?6.4%.  If your OGTT result is 140?199 mg/dL (7.8-11 mmol/L). These blood tests may be repeated to confirm your diagnosis. How can this condition affect me? The pancreas produces a hormone (insulin) that helps to move glucose from the bloodstream into cells. When cells in the body do not respond properly to insulin that the body makes (insulin resistance), excess glucose builds up in the blood instead of going into cells. As a result, high blood glucose (hyperglycemia) can develop, which can cause many complications. Hyperglycemia is a symptom of prediabetes. Having high blood glucose for a long time is dangerous. Too much glucose in your blood can damage your nerves and blood vessels. Long-term damage can lead to complications from diabetes, which may include:  Heart disease.  Stroke.  Blindness.  Kidney disease.  Depression.  Poor circulation in the feet and legs, which could lead to surgical removal (amputation) in severe cases. What can increase my risk? Risk factors for prediabetes include:  Having a family member with type 2 diabetes.  Being overweight or obese.  Being older than age 74.  Being of American [place], African-American, Hispanic/Latino, or Asian/Pacific Islander descent.  Having an inactive (sedentary) lifestyle.  Having a history of heart disease.  History of gestational diabetes or polycystic ovary syndrome (PCOS), in women.  Having low levels of good cholesterol (HDL-C) or high levels of blood fats (triglycerides).  Having high blood pressure. What actions can I  take to prevent diabetes?      Be physically active. ? Do moderate-intensity physical activity for 30 or more minutes on 5 or more days of the week, or as much as told by your health care provider. This could be brisk walking, biking, or water aerobics. ? Ask your health care provider what activities are safe for you. A mix of physical activities may be best, such as walking, swimming, cycling, and strength training.  Lose weight as told by your health care provider. ? Losing 5-7% of your body weight can reverse insulin resistance. ? Your health care provider can determine how much weight loss is best for you and can help you lose weight safely.  Follow a healthy meal plan. This includes eating lean proteins, complex carbohydrates, fresh fruits and vegetables, low-fat dairy products, and healthy fats. ? Follow instructions from your health care provider about eating or drinking restrictions. ? Make an appointment to see a diet and nutrition specialist (registered dietitian) to help you create a healthy eating plan that is right for you.  Do not smoke or use any tobacco products, such as cigarettes, chewing tobacco, and e-cigarettes. If you need help quitting, ask your health care provider.  Take over-the-counter and prescription medicines as told by your health care provider. You may be prescribed medicines that help lower the risk of type 2 diabetes.  Keep all follow-up visits as told by your health care provider. This is important. Summary  Prediabetes is the condition of having a blood sugar (blood glucose) level that is higher than it should be, but not high enough for you to be diagnosed with type 2 diabetes.  Having prediabetes puts you at risk for developing type 2 diabetes (type 2 diabetes mellitus).  To help prevent type 2 diabetes, make lifestyle changes such as being physically active and eating a healthy diet. Lose weight as told by your health care provider. This information  is not intended to replace advice given to  you by your health care provider. Make sure you discuss any questions you have with your health care provider. Document Revised: 07/17/2018 Document Reviewed: 05/16/2015 Elsevier Patient Education  [employer].    If you have lab work done today you will be contacted with your lab results within the next 2 weeks.  If you have not heard from [place] then please contact us. The fastest way to get your results is to register for My Chart.   IF you received an x-ray today, you will receive an invoice from [facility]. Please contact [facility] at [phone] with questions or concerns regarding your invoice.   IF you received labwork today, you will receive an invoice from [place] [facility]. Please contact [facility] at [phone] with questions or concerns regarding your invoice.   Our billing staff will not be able to assist you with questions regarding bills from these companies.  You will be contacted with the lab results as soon as they are available. The fastest way to get your results is to activate your My Chart account. Instructions are located on the last page of this paperwork. If you have not heard from [place] regarding the results in 2 weeks, please contact this office.         Signed, [name], MD [facility]

## 2019-09-15 NOTE — Patient Instructions (Addendum)
Lamisil once per day for toenail fungus.  6-week lab visit for liver test.  If not resolved at end of medication, schedule follow-up visit to discuss further meds.  Recheck with me in 6 months.  Glad to hear you are better, but previous dizziness could have been related to concussion or head injury.  If any return of dizziness, new headaches, or other new symptoms be seen right away.  Watch diet with portions, avoidance of sweetened beverages and desserts as much as possible or smaller portions if possible to help with prediabetes.  Can recheck levels in 6 months.  Thank you for coming today and take care  Keeping You Healthy  Get These Tests  Blood Pressure- Have your blood pressure checked by your healthcare provider at least once a year.  Normal blood pressure is 120/80.  Weight- Have your body mass index (BMI) calculated to screen for obesity.  BMI is a measure of body fat based on height and weight.  You can calculate your own BMI at https://www.west-esparza.com/  Cholesterol- Have your cholesterol checked every year.  Diabetes- Have your blood sugar checked every year if you have high blood pressure, high cholesterol, a family history of diabetes or if you are overweight.  Pap Test - Have a pap test every 1 to 5 years if you have been sexually active.  If you are older than 65 and recent pap tests have been normal you may not need additional pap tests.  In addition, if you have had a hysterectomy  for benign disease additional pap tests are not necessary.  Mammogram-Yearly mammograms are essential for early detection of breast cancer  Screening for Colon Cancer- Colonoscopy starting at age 26. Screening may begin sooner depending on your family history and other health conditions.  Follow up colonoscopy as directed by your Gastroenterologist.  Screening for Osteoporosis- Screening begins at age 25 with bone density scanning, sooner if you are at higher risk for developing  Osteoporosis.  Get these medicines  Calcium with Vitamin D- Your body requires 1200-1500 mg of Calcium a day and [phone] IU of Vitamin D a day.  You can only absorb 500 mg of Calcium at a time therefore Calcium must be taken in 2 or 3 separate doses throughout the day.  Hormones- Hormone therapy has been associated with increased risk for certain cancers and heart disease.  Talk to your healthcare provider about if you need relief from menopausal symptoms.  Aspirin- Ask your healthcare provider about taking Aspirin to prevent Heart Disease and Stroke.  Get these Immuniztions  Flu shot- Every fall  Pneumonia shot- Once after the age of 65; if you are younger ask your healthcare provider if you need a pneumonia shot.  Tetanus- Every ten years.  Zostavax- Once after the age of 21 to prevent shingles.  Take these steps  Don't smoke- Your healthcare provider can help you quit. For tips on how to quit, ask your healthcare provider or go to www.smokefree.gov or call 1-800 QUIT-NOW.  Be physically active- Exercise 5 days a week for a minimum of 30 minutes.  If you are not already physically active, start slow and gradually work up to 30 minutes of moderate physical activity.  Try walking, dancing, bike riding, swimming, etc.  Eat a healthy diet- Eat a variety of healthy foods such as fruits, vegetables, whole grains, low fat milk, low fat cheeses, yogurt, lean meats, chicken, fish, eggs, dried beans, tofu, etc.  For more information go to www.thenutritionsource.org  Dental visit- Brush and floss teeth twice daily; visit your dentist twice a year.  Eye exam- Visit your Optometrist or Ophthalmologist yearly.  Drink alcohol in moderation- Limit alcohol intake to one drink or less a day.  Never drink and drive.  Depression- Your emotional health is as important as your physical health.  If you're feeling down or losing interest in things you normally enjoy, please talk to your healthcare  provider.  Seat Belts- can save your life; always wear one  Smoke/Carbon Monoxide detectors- These detectors need to be installed on the appropriate level of your home.  Replace batteries at least once a year.  Violence- If anyone is threatening or hurting you, please tell your healthcare provider.  Living Will/ Health care power of attorney- Discuss with your healthcare provider and family.    Fungal Nail Infection A fungal nail infection is a common infection of the toenails or fingernails. This condition affects toenails more often than fingernails. It often affects the great, or big, toes. More than one nail may be infected. The condition can be passed from person to person (is contagious). What are the causes? This condition is caused by a fungus. Several types of fungi can cause the infection. These fungi are common in moist and warm areas. If your hands or feet come into contact with the fungus, it may get into a crack in your fingernail or toenail and cause the infection. What increases the risk? The following factors may make you more likely to develop this condition:  Being female.  Being of older age.  Living with someone who has the fungus.  Walking barefoot in areas where the fungus thrives, such as showers or locker rooms.  Wearing shoes and socks that cause your feet to sweat.  Having a nail injury or a recent nail surgery.  Having certain medical conditions, such as: ? Athlete's foot. ? Diabetes. ? Psoriasis. ? Poor circulation. ? A weak body defense system (immune system). What are the signs or symptoms? Symptoms of this condition include:  A pale spot on the nail.  Thickening of the nail.  A nail that becomes yellow or brown.  A brittle or ragged nail edge.  A crumbling nail.  A nail that has lifted away from the nail bed. How is this diagnosed? This condition is diagnosed with a physical exam. Your health care provider may take a scraping or  clipping from your nail to test for the fungus. How is this treated? Treatment is not needed for mild infections. If you have significant nail changes, treatment may include:  Antifungal medicines taken by mouth (orally). You may need to take the medicine for several weeks or several months, and you may not see the results for a long time. These medicines can cause side effects. Ask your health care provider what problems to watch for.  Antifungal nail polish or nail cream. These may be used along with oral antifungal medicines.  Laser treatment of the nail.  Surgery to remove the nail. This may be needed for the most severe infections. It can take a long time, usually up to a year, for the infection to go away. The infection may also come back. Follow these instructions at home: Medicines  Take or apply over-the-counter and prescription medicines only as told by your health care provider.  Ask your health care provider about using over-the-counter mentholated ointment on your nails. Nail care  Trim your nails often.  Wash and dry your hands and  feet every day.  Keep your feet dry: ? Wear absorbent socks, and change your socks frequently. ? Wear shoes that allow air to circulate, such as sandals or canvas tennis shoes. Throw out old shoes.  Do not use artificial nails.  If you go to a nail salon, make sure you choose one that uses clean instruments.  Use antifungal foot powder on your feet and in your shoes. General instructions  Do not share personal items, such as towels or nail clippers.  Do not walk barefoot in shower rooms or locker rooms.  Wear rubber gloves if you are working with your hands in wet areas.  Keep all follow-up visits as told by your health care provider. This is important. Contact a health care provider if: Your infection is not getting better or it is getting worse after several months. Summary  A fungal nail infection is a common infection of the  toenails or fingernails.  Treatment is not needed for mild infections. If you have significant nail changes, treatment may include taking medicine orally and applying medicine to your nails.  It can take a long time, usually up to a year, for the infection to go away. The infection may also come back.  Take or apply over-the-counter and prescription medicines only as told by your health care provider.  Follow instructions for taking care of your nails to help prevent infection from coming back or spreading. This information is not intended to replace advice given to you by your health care provider. Make sure you discuss any questions you have with your health care provider. Document Revised: 07/16/2018 Document Reviewed: 08/29/2017 Elsevier Patient Education  2020 [employer]  Prediabetes Prediabetes is the condition of having a blood sugar (blood glucose) level that is higher than it should be, but not high enough for you to be diagnosed with type 2 diabetes. Having prediabetes puts you at risk for developing type 2 diabetes (type 2 diabetes mellitus). Prediabetes may be called impaired glucose tolerance or impaired fasting glucose. Prediabetes usually does not cause symptoms. Your health care provider can diagnose this condition with blood tests. You may be tested for prediabetes if you are overweight and if you have at least one other risk factor for prediabetes. What is blood glucose, and how is it measured? Blood glucose refers to the amount of glucose in your bloodstream. Glucose comes from eating foods that contain sugars and starches (carbohydrates), which the body breaks down into glucose. Your blood glucose level may be measured in mg/dL (milligrams per deciliter) or mmol/L (millimoles per liter). Your blood glucose may be checked with one or more of the following blood tests:  A fasting blood glucose (FBG) test. You will not be allowed to eat (you will fast) for 8 hours or longer  before a blood sample is taken. ? A normal range for FBG is 70-100 mg/dl (1.6-1.03.9-5.6 mmol/L).  An A1c (hemoglobin A1c) blood test. This test provides information about blood glucose control over the previous 2?3months.  An oral glucose tolerance test (OGTT). This test measures your blood glucose at two times: ? After fasting. This is your baseline level. ? Two hours after you drink a beverage that contains glucose. You may be diagnosed with prediabetes:  If your FBG is 100?125 mg/dL (9.6-0.45.6-6.9 mmol/L).  If your A1c level is 5.7?6.4%.  If your OGTT result is 140?199 mg/dL (5.4-097.8-11 mmol/L). These blood tests may be repeated to confirm your diagnosis. How can this condition affect me? The pancreas  produces a hormone (insulin) that helps to move glucose from the bloodstream into cells. When cells in the body do not respond properly to insulin that the body makes (insulin resistance), excess glucose builds up in the blood instead of going into cells. As a result, high blood glucose (hyperglycemia) can develop, which can cause many complications. Hyperglycemia is a symptom of prediabetes. Having high blood glucose for a long time is dangerous. Too much glucose in your blood can damage your nerves and blood vessels. Long-term damage can lead to complications from diabetes, which may include:  Heart disease.  Stroke.  Blindness.  Kidney disease.  Depression.  Poor circulation in the feet and legs, which could lead to surgical removal (amputation) in severe cases. What can increase my risk? Risk factors for prediabetes include:  Having a family member with type 2 diabetes.  Being overweight or obese.  Being older than age 26.  Being of American [place], African-American, Hispanic/Latino, or Asian/Pacific Islander descent.  Having an inactive (sedentary) lifestyle.  Having a history of heart disease.  History of gestational diabetes or polycystic ovary syndrome (PCOS), in  women.  Having low levels of good cholesterol (HDL-C) or high levels of blood fats (triglycerides).  Having high blood pressure. What actions can I take to prevent diabetes?      Be physically active. ? Do moderate-intensity physical activity for 30 or more minutes on 5 or more days of the week, or as much as told by your health care provider. This could be brisk walking, biking, or water aerobics. ? Ask your health care provider what activities are safe for you. A mix of physical activities may be best, such as walking, swimming, cycling, and strength training.  Lose weight as told by your health care provider. ? Losing 5-7% of your body weight can reverse insulin resistance. ? Your health care provider can determine how much weight loss is best for you and can help you lose weight safely.  Follow a healthy meal plan. This includes eating lean proteins, complex carbohydrates, fresh fruits and vegetables, low-fat dairy products, and healthy fats. ? Follow instructions from your health care provider about eating or drinking restrictions. ? Make an appointment to see a diet and nutrition specialist (registered dietitian) to help you create a healthy eating plan that is right for you.  Do not smoke or use any tobacco products, such as cigarettes, chewing tobacco, and e-cigarettes. If you need help quitting, ask your health care provider.  Take over-the-counter and prescription medicines as told by your health care provider. You may be prescribed medicines that help lower the risk of type 2 diabetes.  Keep all follow-up visits as told by your health care provider. This is important. Summary  Prediabetes is the condition of having a blood sugar (blood glucose) level that is higher than it should be, but not high enough for you to be diagnosed with type 2 diabetes.  Having prediabetes puts you at risk for developing type 2 diabetes (type 2 diabetes mellitus).  To help prevent type 2  diabetes, make lifestyle changes such as being physically active and eating a healthy diet. Lose weight as told by your health care provider. This information is not intended to replace advice given to you by your health care provider. Make sure you discuss any questions you have with your health care provider. Document Revised: 07/17/2018 Document Reviewed: 05/16/2015 Elsevier Patient Education  [employer].    If you have lab work done today  you will be contacted with your lab results within the next 2 weeks.  If you have not heard from [place] then please contact us. The fastest way to get your results is to register for My Chart.   IF you received an x-ray today, you will receive an invoice from [facility]. Please contact [facility] at [phone] with questions or concerns regarding your invoice.   IF you received labwork today, you will receive an invoice from [facility]. Please contact [facility] at [phone] with questions or concerns regarding your invoice.   Our billing staff will not be able to assist you with questions regarding bills from these companies.  You will be contacted with the lab results as soon as they are available. The fastest way to get your results is to activate your My Chart account. Instructions are located on the last page of this paperwork. If you have not heard from [place] regarding the results in 2 weeks, please contact this office.

## 2019-09-16 ENCOUNTER — Encounter: Payer: Self-pay | Admitting: Family Medicine

## 2019-10-27 ENCOUNTER — Ambulatory Visit: Payer: Managed Care, Other (non HMO)

## 2019-10-27 ENCOUNTER — Other Ambulatory Visit: Payer: Self-pay

## 2019-10-27 DIAGNOSIS — Z5181 Encounter for therapeutic drug level monitoring: Secondary | ICD-10-CM

## 2019-10-28 LAB — HEPATIC FUNCTION PANEL
ALT: 11 IU/L (ref 0–32)
AST: 17 IU/L (ref 0–40)
Albumin: 4.3 g/dL (ref 3.8–4.8)
Alkaline Phosphatase: 82 IU/L (ref 48–121)
Bilirubin Total: 0.3 mg/dL (ref 0.0–1.2)
Bilirubin, Direct: 0.09 mg/dL (ref 0.00–0.40)
Total Protein: 6.9 g/dL (ref 6.0–8.5)

## 2019-12-14 ENCOUNTER — Other Ambulatory Visit: Payer: Self-pay | Admitting: Family Medicine

## 2019-12-14 DIAGNOSIS — Z5181 Encounter for therapeutic drug level monitoring: Secondary | ICD-10-CM

## 2019-12-14 DIAGNOSIS — B351 Tinea unguium: Secondary | ICD-10-CM

## 2019-12-14 NOTE — Telephone Encounter (Signed)
Requested medications are due for refill today?  Yes   Requested medications are on active medication list?  Yes  Last Refill:  09/15/2019  # 90 with no refills  Future visit scheduled?  Yes in 3 months  Notes to Clinic:   Medication not assigned to a protocol.  Please review.

## 2019-12-30 ENCOUNTER — Other Ambulatory Visit: Payer: Self-pay | Admitting: Family Medicine

## 2019-12-30 DIAGNOSIS — Z1231 Encounter for screening mammogram for malignant neoplasm of breast: Secondary | ICD-10-CM

## 2020-01-03 ENCOUNTER — Ambulatory Visit
Admission: RE | Admit: 2020-01-03 | Discharge: 2020-01-03 | Disposition: A | Discharge status: Home / Self Care | Payer: Managed Care, Other (non HMO) | Source: Ambulatory Visit | Attending: Family Medicine | Admitting: Family Medicine

## 2020-01-03 ENCOUNTER — Other Ambulatory Visit: Payer: Self-pay

## 2020-01-03 DIAGNOSIS — Z1231 Encounter for screening mammogram for malignant neoplasm of breast: Secondary | ICD-10-CM

## 2020-01-05 ENCOUNTER — Telehealth: Payer: Self-pay | Admitting: Family Medicine

## 2020-01-05 NOTE — Telephone Encounter (Signed)
Error

## 2020-02-03 ENCOUNTER — Telehealth: Payer: Self-pay

## 2020-02-03 NOTE — Telephone Encounter (Signed)
Pt last seen 09/15/2019 would you like OV before referring for said issue?

## 2020-02-03 NOTE — Telephone Encounter (Signed)
[facility] called on behalf of pt. Requesting referral for pt. To see Dr. Lona Kettle. Specifically for pain in both knees.   Contact number [phone]

## 2020-02-03 NOTE — Telephone Encounter (Signed)
Unfortunately I do not see where we discussed her knee pain recently, and in order to provide a referral I think we will need to at least discuss her symptoms and current treatments further.    I do see where in 2019 we did refer her to Dr. Lequita Halt for right knee pain.  Please schedule appointment and we can discuss knee pain and options further, including possible referral to [facility].  Thanks.

## 2020-02-08 NOTE — Telephone Encounter (Signed)
Called pt and made appt for 02/24/20

## 2020-02-24 ENCOUNTER — Ambulatory Visit: Payer: Managed Care, Other (non HMO) | Admitting: Family Medicine

## 2020-03-16 ENCOUNTER — Ambulatory Visit (INDEPENDENT_AMBULATORY_CARE_PROVIDER_SITE_OTHER): Payer: Managed Care, Other (non HMO) | Admitting: Family Medicine

## 2020-03-16 ENCOUNTER — Encounter: Payer: Self-pay | Admitting: Family Medicine

## 2020-03-16 ENCOUNTER — Ambulatory Visit: Payer: Managed Care, Other (non HMO) | Admitting: Family Medicine

## 2020-03-16 ENCOUNTER — Other Ambulatory Visit: Payer: Self-pay

## 2020-03-16 VITALS — BP 127/84 | HR 65 | Temp 98.0°F | Ht 63.0 in | Wt 255.0 lb

## 2020-03-16 DIAGNOSIS — M25561 Pain in right knee: Secondary | ICD-10-CM | POA: Diagnosis not present

## 2020-03-16 DIAGNOSIS — Z1211 Encounter for screening for malignant neoplasm of colon: Secondary | ICD-10-CM | POA: Diagnosis not present

## 2020-03-16 DIAGNOSIS — B351 Tinea unguium: Secondary | ICD-10-CM

## 2020-03-16 DIAGNOSIS — R7303 Prediabetes: Secondary | ICD-10-CM

## 2020-03-16 DIAGNOSIS — M25562 Pain in left knee: Secondary | ICD-10-CM

## 2020-03-16 DIAGNOSIS — E785 Hyperlipidemia, unspecified: Secondary | ICD-10-CM

## 2020-03-16 DIAGNOSIS — Z5181 Encounter for therapeutic drug level monitoring: Secondary | ICD-10-CM

## 2020-03-16 MED ORDER — TERBINAFINE HCL 250 MG PO TABS: 250.0000 mg | ORAL_TABLET | Freq: Every day | ORAL | 2 refills | Status: DC

## 2020-03-16 NOTE — Progress Notes (Signed)
Subjective:  Patient ID: Teresa Strong, female    DOB: 08/16/1955  Age: 64 y.o. MRN: [id number]  CC:  Chief Complaint  Patient presents with  . Follow-up    On medication changes from appt back on 09/15/2019. Pt reports no known side effects from the medication change. PT reports she is no logner having dizzy spells fromm the fall last ov.  . Referral    Pt would like a referral to [facility] for knee injections. Bot both knees, but pt reports her R knee is the worst. Pt also needs a referral to GI for colonoscopy.    HPI Teresa Strong presents for  Follow-up from June with physical and other concerns assess at that time.  Dizziness has resolved.  Had fall in August - going up steps, shoes caught step, scraped leg - fell onto knees, able to weight bear. Doing ok now.   Onychomycosis: Treated with Lamisil 250 mg daily in June - plan for 12 weeks. Still taking lamisil once per day.  Nails 50% better.  Lab Results  Component Value Date   ALT 11 10/27/2019   AST 17 10/27/2019   ALKPHOS 82 10/27/2019   BILITOT 0.3 10/27/2019   Colon cancer screening: Discussed Cologuard versus colonoscopy in June.  Few benign polyps in 2011, 10-year follow-up discussed, Cologuard was ordered. Would like to meet with GI.   Bilateral knee pain: Pain since about 2013 or 2014. Tripped and sore in R knee greater than left knee.  Ongoing knee pain off and on, some good days, some bad. Ibuprofen helps, but upsets stomach.  Has seen ortho - had cortisone injections. Had viscosupplementation around 5 years ago. Last injections about 5 years ago had some relief, but unable to continue. Some relief, then pain comes back. Surgery had been recommended. Steroid shots have helped prior. Acupuncture helped, but was cost prohibitive. Has met with Dr. Turner Daniels, Dr. Eulah Pont in past.  Last x-ray noted from 2014 of the left knee with patellofemoral joint space narrowing as well as medial compartment narrowing. Would like to  meet with [facility] to discuss injections and options.   Prediabetes Wt Readings from Last 3 Encounters:  03/16/20 255 lb (115.7 kg)  09/15/19 257 lb (116.6 kg)  03/21/17 240 lb (108.9 kg)  Weight down a few pounds from June. Lab Results  Component Value Date   HGBA1C 5.8 (H) 09/10/2019   Hyperlipidemia: No current meds, weight down few pounds as above.  Not fasting currently.  The 10-year ASCVD risk score Denman George DC Montez Hageman., et al., 2013) is: 4.4%   Values used to calculate the score:     Age: 26 years     Sex: Female     Is Non-Hispanic African American: No     Diabetic: No     Tobacco smoker: No     Systolic Blood Pressure: 127 mmHg     Is BP treated: No     HDL Cholesterol: 52 mg/dL     Total Cholesterol: 219 mg/dL  Lab Results  Component Value Date   CHOL 219 (H) 09/10/2019   HDL 52 09/10/2019   LDLCALC 148 (H) 09/10/2019   TRIG 105 09/10/2019   CHOLHDL 4.2 09/10/2019   Lab Results  Component Value Date   ALT 11 10/27/2019   AST 17 10/27/2019   ALKPHOS 82 10/27/2019   BILITOT 0.3 10/27/2019        History Patient Active Problem List   Diagnosis Date Noted  . Bell's palsy 06/14/2013  .  GERD (gastroesophageal reflux disease) 09/22/2012  . BMI 40.0-44.9, adult (HCC) 09/22/2012  . Left leg pain 09/03/2012  . Left leg swelling 09/03/2012   Past Medical History:  Diagnosis Date  . Arthritis   . Bell's palsy   . GERD (gastroesophageal reflux disease)    Past Surgical History:  Procedure Laterality Date  . ABDOMINAL HYSTERECTOMY     No Known Allergies Prior to Admission medications   Medication Sig Start Date End Date Taking? Authorizing Provider  omeprazole (PRILOSEC) 20 MG capsule Take 20 mg by mouth daily.   Yes [provider]  terbinafine (LAMISIL) 250 MG tablet TAKE 1 TABLET BY MOUTH EVERY DAY 12/15/19  Yes Shade Flood, MD   Social History   Socioeconomic History  . Marital status: Single    Spouse name: Not on file  . Number of  children: Not on file  . Years of education: Not on file  . Highest education level: Not on file  Occupational History  . Not on file  Tobacco Use  . Smoking status: Former Games developer  . Smokeless tobacco: Never Used  Vaping Use  . Vaping Use: Never used  Substance and Sexual Activity  . Alcohol use: Not Currently    Alcohol/week: 0.0 standard drinks    Comment: social  . Drug use: No  . Sexual activity: Yes    Birth control/protection: Surgical    Comment: number of sex partners in the last 12 months 1  Other Topics Concern  . Not on file  Social History Narrative  . Not on file   Social Determinants of Health   Financial Resource Strain: Not on file  Food Insecurity: Not on file  Transportation Needs: Not on file  Physical Activity: Not on file  Stress: Not on file  Social Connections: Not on file  Intimate Partner Violence: Not on file    Review of Systems Per HPI.   Objective:   Vitals:   03/16/20 1547  BP: 127/84  Pulse: 65  Temp: 98 F (36.7 C)  TempSrc: Temporal  SpO2: 99%  Weight: 255 lb (115.7 kg)  Height: 5\' 3"  (1.6 m)     Physical Exam Constitutional:      General: She is not in acute distress.    Appearance: Normal appearance. She is well-developed and well-nourished.  HENT:     Head: Normocephalic and atraumatic.  Cardiovascular:     Rate and Rhythm: Normal rate.  Pulmonary:     Effort: Pulmonary effort is normal.  Musculoskeletal:        General: Tenderness present.     Comments: Intact range of motion bilaterally, medial joint line tenderness of both knees.  Skin:    Comments: Great toenails with slight thickening and discoloration on the distal 1/3-1/4 of nail only.  Neurological:     Mental Status: She is alert and oriented to person, place, and time.  Psychiatric:        Mood and Affect: Mood and affect normal.      Assessment & Plan:  Teresa Strong is a 64 y.o. female . Pain in both knees, unspecified chronicity - Plan:  Ambulatory referral to Orthopedic Surgery  -With history of osteoarthritis.  Prior orthopedic evaluation and treatment as above including previous knee viscosupplementation as well as steroid injections with temporary improvement.  She would like to meet with [facility] to evaluate for alternative treatments surgery.  Suspect they may discuss Visco supplementation again, possible PRP.  She will meet with them, then  if orthopedic evaluation also requested, and can place another referral.  Onychomycosis - Plan: terbinafine (LAMISIL) 250 MG tablet, CANCELED: Hepatic Function Panel  -Nearly resolved, continue Lamisil for another 6 to 12 weeks, check LFTs.  Medication monitoring encounter - Plan: terbinafine (LAMISIL) 250 MG tablet, Comprehensive metabolic panel, CANCELED: Hepatic Function Panel  Special screening for malignant neoplasms, colon - Plan: Ambulatory referral to Gastroenterology  -Refer for colonoscopy, declined Cologuard at this point.  Prediabetes - Plan: Hemoglobin A1c  -Check A1c, watch diet, exercise for weight loss, but limited with her knee issues.  Hyperlipidemia, unspecified hyperlipidemia type - Plan: Comprehensive metabolic panel, Lipid panel  -Check labs, ASCVD risk score to discuss need for meds.  Meds ordered this encounter  Medications  . terbinafine (LAMISIL) 250 MG tablet    Sig: Take 1 tablet (250 mg total) by mouth daily.    Dispense:  30 tablet    Refill:  2   Patient Instructions     I will refer you to [facility] to discuss options for your knee pain, as well as to gastroenterology for colonoscopy.  If you would also like a referral to orthopedist, let me know and I am happy to place that as well.  Continue Lamisil for another 6 weeks, if discoloration has not resolved, can continue for 6 more weeks, but will need to check your liver function test.  Let me know and I will place a lab only order.  Thank you for coming in today and let me know if there are  questions.  If you have lab work done today you will be contacted with your lab results within the next 2 weeks.  If you have not heard from [place] then please contact us. The fastest way to get your results is to register for My Chart.   IF you received an x-ray today, you will receive an invoice from [facility]. Please contact [facility] at [phone] with questions or concerns regarding your invoice.   IF you received labwork today, you will receive an invoice from [place]. Please contact [facility] at [phone] with questions or concerns regarding your invoice.   Our billing staff will not be able to assist you with questions regarding bills from these companies.  You will be contacted with the lab results as soon as they are available. The fastest way to get your results is to activate your My Chart account. Instructions are located on the last page of this paperwork. If you have not heard from [place] regarding the results in 2 weeks, please contact this office.         Signed, [name], MD [facility]

## 2020-03-16 NOTE — Patient Instructions (Addendum)
   I will refer you to [facility] to discuss options for your knee pain, as well as to gastroenterology for colonoscopy.  If you would also like a referral to orthopedist, let me know and I am happy to place that as well.  Continue Lamisil for another 6 weeks, if discoloration has not resolved, can continue for 6 more weeks, but will need to check your liver function test.  Let me know and I will place a lab only order.  Thank you for coming in today and let me know if there are questions.  If you have lab work done today you will be contacted with your lab results within the next 2 weeks.  If you have not heard from [place] then please contact us. The fastest way to get your results is to register for My Chart.   IF you received an x-ray today, you will receive an invoice from [facility]. Please contact [facility] at [phone] with questions or concerns regarding your invoice.   IF you received labwork today, you will receive an invoice from [place]. Please contact [facility] at [phone] with questions or concerns regarding your invoice.   Our billing staff will not be able to assist you with questions regarding bills from these companies.  You will be contacted with the lab results as soon as they are available. The fastest way to get your results is to activate your My Chart account. Instructions are located on the last page of this paperwork. If you have not heard from [place] regarding the results in 2 weeks, please contact this office.

## 2020-03-17 ENCOUNTER — Encounter: Payer: Self-pay | Admitting: Family Medicine

## 2020-03-24 ENCOUNTER — Ambulatory Visit: Payer: Managed Care, Other (non HMO)

## 2020-03-30 ENCOUNTER — Ambulatory Visit: Payer: Managed Care, Other (non HMO)

## 2020-04-05 ENCOUNTER — Ambulatory Visit: Payer: Managed Care, Other (non HMO)

## 2020-04-05 ENCOUNTER — Other Ambulatory Visit: Payer: Self-pay

## 2020-04-05 DIAGNOSIS — R7303 Prediabetes: Secondary | ICD-10-CM

## 2020-04-05 DIAGNOSIS — Z5181 Encounter for therapeutic drug level monitoring: Secondary | ICD-10-CM

## 2020-04-05 DIAGNOSIS — E785 Hyperlipidemia, unspecified: Secondary | ICD-10-CM

## 2020-04-06 LAB — COMPREHENSIVE METABOLIC PANEL
ALT: 8 IU/L (ref 0–32)
AST: 11 IU/L (ref 0–40)
Albumin/Globulin Ratio: 1.7 (ref 1.2–2.2)
Albumin: 4.5 g/dL (ref 3.8–4.8)
Alkaline Phosphatase: 93 IU/L (ref 44–121)
BUN/Creatinine Ratio: 19 (ref 12–28)
BUN: 14 mg/dL (ref 8–27)
Bilirubin Total: 0.3 mg/dL (ref 0.0–1.2)
CO2: 25 mmol/L (ref 20–29)
Calcium: 9.7 mg/dL (ref 8.7–10.3)
Chloride: 104 mmol/L (ref 96–106)
Creatinine, Ser: 0.75 mg/dL (ref 0.57–1.00)
GFR calc Af Amer: 99 mL/min/{1.73_m2} (ref >59)
GFR calc non Af Amer: 86 mL/min/{1.73_m2} (ref >59)
Globulin, Total: 2.6 g/dL (ref 1.5–4.5)
Glucose: 96 mg/dL (ref 65–99)
Potassium: 4.9 mmol/L (ref 3.5–5.2)
Sodium: 142 mmol/L (ref 134–144)
Total Protein: 7.1 g/dL (ref 6.0–8.5)

## 2020-04-06 LAB — HEMOGLOBIN A1C
Est. average glucose Bld gHb Est-mCnc: 120 mg/dL
Hgb A1c MFr Bld: 5.8 % — ABNORMAL HIGH (ref 4.8–5.6)

## 2020-04-06 LAB — LIPID PANEL
Chol/HDL Ratio: 4.1 ratio (ref 0.0–4.4)
Cholesterol, Total: 224 mg/dL — ABNORMAL HIGH (ref 100–199)
HDL: 54 mg/dL (ref >39)
LDL Chol Calc (NIH): 155 mg/dL — ABNORMAL HIGH (ref 0–99)
Triglycerides: 85 mg/dL (ref 0–149)
VLDL Cholesterol Cal: 15 mg/dL (ref 5–40)

## 2020-05-01 ENCOUNTER — Telehealth: Payer: Self-pay | Admitting: Family Medicine

## 2020-05-01 NOTE — Telephone Encounter (Signed)
Pt  would like to change her referral to go see Dr. Lequita Halt for her ortho issue. Please advise at [phone].

## 2020-05-09 NOTE — Telephone Encounter (Signed)
Pt wanted ortho to be sent to Dr Lequita Halt. Please send to them so pt can be seen

## 2020-05-09 NOTE — Telephone Encounter (Signed)
Pt called to check the status about referral with Dr. Lequita Halt with [facility]/ please advise

## 2020-05-23 NOTE — Telephone Encounter (Signed)
Pt checking on status of this message. She is wanting Dr. Nani Skillern to see her.

## 2020-08-08 ENCOUNTER — Other Ambulatory Visit: Payer: Self-pay | Admitting: Family Medicine

## 2020-08-08 DIAGNOSIS — Z5181 Encounter for therapeutic drug level monitoring: Secondary | ICD-10-CM

## 2020-08-08 DIAGNOSIS — B351 Tinea unguium: Secondary | ICD-10-CM

## 2020-08-08 NOTE — Telephone Encounter (Signed)
Refilled Lamisil but needs updated labs to monitor - please schedule lab visit and let her know.

## 2020-08-08 NOTE — Telephone Encounter (Signed)
Called pt to schedule lab only visit as the order for the Hepatic function panel has already been future ordered.  No answer and mailbox was full. Will call again later

## 2020-08-08 NOTE — Telephone Encounter (Signed)
LFD 03/16/20 #30 with 2 refills LOV 03/16/20 NOV none

## 2020-10-05 ENCOUNTER — Other Ambulatory Visit: Payer: Self-pay

## 2020-10-05 ENCOUNTER — Ambulatory Visit (INDEPENDENT_AMBULATORY_CARE_PROVIDER_SITE_OTHER): Payer: Managed Care, Other (non HMO) | Admitting: Family Medicine

## 2020-10-05 ENCOUNTER — Telehealth: Payer: Self-pay | Admitting: Family Medicine

## 2020-10-05 ENCOUNTER — Encounter: Payer: Self-pay | Admitting: Family Medicine

## 2020-10-05 VITALS — BP 122/82 | HR 62 | Temp 98.0°F | Wt 233.0 lb

## 2020-10-05 DIAGNOSIS — R9431 Abnormal electrocardiogram [ECG] [EKG]: Secondary | ICD-10-CM

## 2020-10-05 DIAGNOSIS — Z01818 Encounter for other preprocedural examination: Secondary | ICD-10-CM

## 2020-10-05 NOTE — Telephone Encounter (Signed)
..  Type of form received:  Pre Operative Risk Evaluation  Additional comments:   Received [id number]  Form should be Faxed to:  [phone]  Form should be mailed to:    Is patient requesting call for pickup:   Form placed:  In Dr. Paralee Cancel bin up front  Attach charge sheet.  Provider will determine charge.  Individual made aware of 3-5 business day turn around (Y/N)?

## 2020-10-05 NOTE — Patient Instructions (Signed)
I do not anticipate any concerns in paperwork for surgery.  I would like to have cardiology review the EKG but I suspect it will be okay.  Once I have that information I will be able to complete the paperwork for your surgeon.  Good luck and please let me know if there are questions!  Congratulations on the weight loss.

## 2020-10-05 NOTE — Progress Notes (Addendum)
Subjective:  Patient ID: Teresa Strong, female    DOB: 1955-11-14  Age: 65 y.o. MRN: [id number]  CC:  Chief Complaint  Patient presents with   Pre-op Exam    Right total knee replacement, July 13th    HPI Teresa Strong presents for  Preoperative exam for right total knee replacement planned on July 13.  Dr. Lequita Halt.  No known cardiac history.  Has taken Prilosec for GERD. No history of stroke/TIA.  No chronic kidney disease.  No chest pain with exertion including 2 flights of stairs. No recent surgery, but tolerated anesthesia with hysterectomy. Min nausea. No known history of sleep apnea. Well rested during day. ? Snoring prior.  No loose teeth.  No hx of DM.  RCRI Goldman risk score of 0  Losing weight with weight watchers has to be under BMI 40 for surgery.  Had preop labs yesterday ordered by Ortho.   Onychomycosis resolved.   Wt Readings from Last 3 Encounters:  10/05/20 233 lb (105.7 kg)  03/16/20 255 lb (115.7 kg)  09/15/19 257 lb (116.6 kg)   Lab Results  Component Value Date   CREATININE 0.75 04/05/2020    History Patient Active Problem List   Diagnosis Date Noted   Bell's palsy 06/14/2013   GERD (gastroesophageal reflux disease) 09/22/2012   BMI 40.0-44.9, adult (HCC) 09/22/2012   Left leg pain 09/03/2012   Left leg swelling 09/03/2012   Past Medical History:  Diagnosis Date   Arthritis    Bell's palsy    GERD (gastroesophageal reflux disease)    Past Surgical History:  Procedure Laterality Date   ABDOMINAL HYSTERECTOMY     No Known Allergies Prior to Admission medications   Medication Sig Start Date End Date Taking? Authorizing Provider  omeprazole (PRILOSEC) 20 MG capsule Take 20 mg by mouth daily.   Yes [provider]  terbinafine (LAMISIL) 250 MG tablet TAKE 1 TABLET(250 MG) BY MOUTH DAILY 08/08/20   Shade Flood, MD   Social History   Socioeconomic History   Marital status: Single    Spouse name: Not on file   Number of  children: Not on file   Years of education: Not on file   Highest education level: Not on file  Occupational History   Not on file  Tobacco Use   Smoking status: Former    Pack years: 0.00   Smokeless tobacco: Never  Vaping Use   Vaping Use: Never used  Substance and Sexual Activity   Alcohol use: Not Currently    Alcohol/week: 0.0 standard drinks    Comment: social   Drug use: No   Sexual activity: Yes    Birth control/protection: Surgical    Comment: number of sex partners in the last 12 months 1  Other Topics Concern   Not on file  Social History Narrative   Not on file   Social Determinants of Health   Financial Resource Strain: Not on file  Food Insecurity: Not on file  Transportation Needs: Not on file  Physical Activity: Not on file  Stress: Not on file  Social Connections: Not on file  Intimate Partner Violence: Not on file    Review of Systems Per HPI.   Objective:   Vitals:   10/05/20 0920  BP: 122/82  Pulse: 62  Temp: 98 F (36.7 C)  TempSrc: Temporal  SpO2: 97%  Weight: 233 lb (105.7 kg)    Physical Exam Vitals reviewed.  Constitutional:      Appearance:  Normal appearance. She is well-developed.  HENT:     Head: Normocephalic and atraumatic.  Eyes:     Conjunctiva/sclera: Conjunctivae normal.     Pupils: Pupils are equal, round, and reactive to light.  Neck:     Vascular: No carotid bruit.  Cardiovascular:     Rate and Rhythm: Normal rate and regular rhythm.     Heart sounds: Normal heart sounds. No murmur heard.   No gallop.  Pulmonary:     Effort: Pulmonary effort is normal.     Breath sounds: Normal breath sounds.  Abdominal:     Palpations: Abdomen is soft. There is no pulsatile mass.     Tenderness: There is no abdominal tenderness.  Musculoskeletal:     Right lower leg: No edema.     Left lower leg: No edema.  Skin:    General: Skin is warm and dry.  Neurological:     Mental Status: She is alert and oriented to person,  place, and time.  Psychiatric:        Mood and Affect: Mood normal.        Behavior: Behavior normal.    EKG, no prior comparison.  Rate 61.  Sinus rhythm.  Possible poor R wave progression but no acute findings.  Assessment & Plan:  Teresa Strong is a 65 y.o. female . Pre-op exam - Plan: EKG 12-Lead, DG Chest 2 View, CANCELED: CBC with Differential/Platelet, CANCELED: Basic Metabolic Panel (BMET), CANCELED: Albumin, CANCELED: HgB A1c, CANCELED: Urinalysis, Routine w reflex microscopic  - moderate risk surgery, no apparent increased risk factors for major adverse cardiac event, RCRI index score of 0.  We will hold on further cardiac evaluation at this time.  Few borderline abnormalities possible on EKG but will have cardiology review.  Lab work was deferred as apparently that was performed by orthopedics yesterday.  Will order chest x-ray.  Anticipate paperwork to be completed for her surgeon within the next 1 week.  Patient Instructions  I do not anticipate any concerns in paperwork for surgery.  I would like to have cardiology review the EKG but I suspect it will be okay.  Once I have that information I will be able to complete the paperwork for your surgeon.  Good luck and please let me know if there are questions!  Congratulations on the weight loss.     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   She has not had any chest pain with exertion, no known previous heart issues, chest pain, or MI.   With RCRI index of 0, no chest pain, dyspnea, or other cardiac symptoms with at least 7 METS of work load, does not appear to be at any increased risk of major adverse cardiac event.  Additionally she does not have any heart failure signs.   Does not appear to be any increased risk of surgery and will complete paperwork for surgeon.  However we will have her follow-up with cardiology outpatient to review EKG further and decide if further imaging/testing needed.  Discussed plan with patient and agrees.

## 2020-10-06 ENCOUNTER — Ambulatory Visit (INDEPENDENT_AMBULATORY_CARE_PROVIDER_SITE_OTHER)
Admission: RE | Admit: 2020-10-06 | Discharge: 2020-10-06 | Disposition: A | Discharge status: Home / Self Care | Payer: Managed Care, Other (non HMO) | Source: Ambulatory Visit | Attending: Family Medicine | Admitting: Family Medicine

## 2020-10-06 DIAGNOSIS — Z01818 Encounter for other preprocedural examination: Secondary | ICD-10-CM | POA: Diagnosis not present

## 2020-10-11 ENCOUNTER — Telehealth: Payer: Self-pay | Admitting: Family Medicine

## 2020-10-11 NOTE — Telephone Encounter (Signed)
Patient would like to know if Dr.Greene has reviewed her EKG from the other day.  Patient states that he was going to have a cardiologist review and then get back with her.  Patient has not heard anything.  Please advise.

## 2020-10-12 NOTE — Addendum Note (Signed)
Addended by: [name] R on: 10/12/2020 07:25 PM   Modules accepted: Orders

## 2020-10-12 NOTE — Telephone Encounter (Signed)
Please apologize for the delay.  I did send a message to cardiology earlier this week and received message back today.  Should not be a problem with clearing her but I can discuss EKG further with her on the phone.  I may need to make that call later tonight as I am currently seeing patients.  I anticipate completing her paperwork to be sent out tomorrow morning.

## 2020-10-12 NOTE — Telephone Encounter (Signed)
Olegario Messier at [facility] [place] called and would like the ekg /medical faxed to them after it has been reviewed by Dr.Greene - Fax:  [phone]

## 2020-10-12 NOTE — Telephone Encounter (Signed)
I called patient with the plan.  Paperwork completed and ready to send with EKG as well as copy of chest x-ray report as needed.  Lab work was reportedly completed by orthopedics.

## 2020-10-13 NOTE — Telephone Encounter (Signed)
Faxed and received successful transmission

## 2020-11-27 ENCOUNTER — Encounter: Payer: Self-pay | Admitting: Cardiology

## 2020-11-27 ENCOUNTER — Other Ambulatory Visit: Payer: Self-pay

## 2020-11-27 ENCOUNTER — Ambulatory Visit: Payer: Managed Care, Other (non HMO) | Admitting: Cardiology

## 2020-11-27 VITALS — BP 123/78 | HR 86 | Temp 98.1°F | Resp 16 | Ht 63.0 in | Wt 230.4 lb

## 2020-11-27 DIAGNOSIS — Z6841 Body Mass Index (BMI) 40.0 and over, adult: Secondary | ICD-10-CM

## 2020-11-27 DIAGNOSIS — R9431 Abnormal electrocardiogram [ECG] [EKG]: Secondary | ICD-10-CM

## 2020-11-27 DIAGNOSIS — R7303 Prediabetes: Secondary | ICD-10-CM

## 2020-11-27 DIAGNOSIS — Z87891 Personal history of nicotine dependence: Secondary | ICD-10-CM

## 2020-11-27 DIAGNOSIS — E7800 Pure hypercholesterolemia, unspecified: Secondary | ICD-10-CM

## 2020-11-27 NOTE — Progress Notes (Signed)
Date:  11/27/2020   ID:  Larry Knipp, DOB 1955/11/06, MRN [id number]  PCP:  Shade Flood, MD  Cardiologist:  Tessa Lerner, DO, [facility] (established care 11/27/2020)  REASON FOR CONSULT: Abnormal EKG  REQUESTING PHYSICIAN:  Shade Flood, MD [address] [place],  [place] [address]  Chief Complaint  Patient presents with   Abnormal ECG   New Patient (Initial Visit)    HPI  Teresa Strong is a 65 y.o. female who presents to the office with a chief complaint of " abnormal EKG." Patient's past medical history and cardiovascular risk factors include: Prediabetes, hyperlipidemia, former smoker, obesity due to excess calories.  She is referred to the office at the request of Shade Flood, MD for evaluation of abnormal EKG.  Patient presents today for evaluation of an abnormal EKG which was noted as part of her preoperative risk evaluation.  Patient states that she underwent a right knee replacement on October 18, 2020.  However, prior to that she underwent numerous tests one of them being an EKG.  Patient states that she was told that her EKG was overall normal but there were some changes that were a bit concerning and to follow-up with cardiology.  Patient has undergone knee replacement surgery without any complications and presents today for establishing care and to review EKG.  Clinically she denies any chest pain or shortness of breath at rest or with effort related activities.  She is not exercising on a regular basis given the fact she is postop knee replacement but is walking independently and participating in cardiac rehab.  Prior to being scheduled she had lost approximately 20 to 25 pounds with lifestyle changes and plans to continue implementing those changes to facilitate weight loss.  She denies any prior history of CAD/PCI/CHF/TIA/strokes/PE/DVT.  FUNCTIONAL STATUS: No structured exercise program or daily routine as she is currently status post right knee  replacement.  She is walking independently and is participating in physical rehab.  ALLERGIES: No Known Allergies  MEDICATION LIST PRIOR TO VISIT: Current Meds  Medication Sig   gabapentin (NEURONTIN) 300 MG capsule Take 1 capsule by mouth daily at 12 noon.   methocarbamol (ROBAXIN) 500 MG tablet Take 500 mg by mouth every 6 (six) hours as needed.   omeprazole (PRILOSEC) 20 MG capsule Take 20 mg by mouth daily.     PAST MEDICAL HISTORY: Past Medical History:  Diagnosis Date   Arthritis    Bell's palsy    GERD (gastroesophageal reflux disease)    Hyperlipidemia    Obesity    Prediabetes     PAST SURGICAL HISTORY: Past Surgical History:  Procedure Laterality Date   ABDOMINAL HYSTERECTOMY     TOTAL KNEE ARTHROPLASTY Right     FAMILY HISTORY: The patient family history includes Alcohol abuse in her mother; Breast cancer in her maternal grandfather and maternal grandmother; Cancer in her maternal grandmother; Cirrhosis in her mother; Diabetes in her brother; Lung cancer in her father.  SOCIAL HISTORY:  The patient  reports that she quit smoking about 42 years ago. Her smoking use included cigarettes. She has a 0.75 pack-year smoking history. She has never used smokeless tobacco. She reports that she does not currently use alcohol. She reports that she does not use drugs.  REVIEW OF SYSTEMS: Review of Systems  Constitutional: Negative for chills and fever.  HENT:  Negative for hoarse voice and nosebleeds.   Eyes:  Negative for discharge, double vision and pain.  Cardiovascular:  Negative for chest pain, claudication, dyspnea on exertion, leg swelling, near-syncope, orthopnea, palpitations, paroxysmal nocturnal dyspnea and syncope.  Respiratory:  Negative for hemoptysis and shortness of breath.   Musculoskeletal:  Negative for muscle cramps and myalgias.  Gastrointestinal:  Negative for abdominal pain, constipation, diarrhea, hematemesis, hematochezia, melena, nausea and  vomiting.  Neurological:  Negative for dizziness and light-headedness.   PHYSICAL EXAM: Vitals with BMI 11/27/2020 10/05/2020 03/16/2020  Height 5\' 3"  - 5\' 3"   Weight 230 lbs 6 oz 233 lbs 255 lbs  BMI 40.82 - 45.18  Systolic 123 122  Diastolic 78 82 84  Pulse 86 62 65    CONSTITUTIONAL: Well-developed and well-nourished. No acute distress.  SKIN: Skin is warm and dry. No rash noted. No cyanosis. No pallor. No jaundice HEAD: Normocephalic and atraumatic.  EYES: No scleral icterus MOUTH/THROAT: Moist oral membranes.  NECK: No JVD present. No thyromegaly noted. No carotid bruits  LYMPHATIC: No visible cervical adenopathy.  CHEST Normal respiratory effort. No intercostal retractions  LUNGS: Clear to auscultation bilaterally.  No stridor. No wheezes. No rales.  CARDIOVASCULAR: Regular rate and rhythm, positive S1-S2, no murmurs rubs or gallops appreciated ABDOMINAL: Obese, soft, nontender, nondistended, positive bowel sounds in all 4 quadrants no apparent ascites.  EXTREMITIES: Trace right lower extremity pitting edema, surgical scar over the right patellar region is healing well, 2+ DP and PT pulses bilaterally. HEMATOLOGIC: No significant bruising NEUROLOGIC: Oriented to person, place, and time. Nonfocal. Normal muscle tone.  PSYCHIATRIC: Normal mood and affect. Normal behavior. Cooperative  CARDIAC DATABASE: EKG: 11/27/2020: Normal sinus rhythm, 80 bpm, left axis deviation, LAFB, without underlying ischemia or injury pattern.   Echocardiogram: No results found for this or any previous visit from the past 1095 days.   Stress Testing: No results found for this or any previous visit from the past 1095 days.  Heart Catheterization: None  LABORATORY DATA: CBC Latest Ref Rng & Units 09/10/2019 07/18/2016 08/24/2012  WBC 3.4 - 10.8 x10E3/uL 4.6 4.0(A) 4.6  Hemoglobin 11.1 - 15.9 g/dL 09/17/2016 08/26/2012 67.2  Hematocrit 34.0 - 46.6 % 40.2 38.8 37.5  Platelets 150 - 450 x10E3/uL 259 - 245     CMP Latest Ref Rng & Units 04/05/2020 10/27/2019 09/10/2019  Glucose 65 - 99 mg/dL 96 - 10/29/2019)  BUN 8 - 27 mg/dL 14 - 13  Creatinine 11/10/2019 - 1.00 mg/dL 628(Z - 6.62  Sodium 9.47 - 144 mmol/L 142 - 139  Potassium 3.5 - 5.2 mmol/L 4.9 - 4.8  Chloride 96 - 106 mmol/L 104 - 103  CO2 20 - 29 mmol/L 25 - 21  Calcium 8.7 - 10.3 mg/dL 9.7 - 9.8  Total Protein 6.0 - 8.5 g/dL 7.1 6.9 7.3  Total Bilirubin 0.0 - 1.2 mg/dL 0.3 0.3 0.4  Alkaline Phos 44 - 121 IU/L 93 82 95  AST 0 - 40 IU/L 11 17 15   ALT 0 - 32 IU/L 8 11 9     Lipid Panel  Lab Results  Component Value Date   CHOL 224 (H) 04/05/2020   HDL 54 04/05/2020   LDLCALC 155 (H) 04/05/2020   TRIG 85 04/05/2020   CHOLHDL 4.1 04/05/2020   No components found for: NTPROBNP No results for input(s): PROBNP in the last 8760 hours. No results for input(s): TSH in the last 8760 hours.  BMP Recent Labs    04/05/20 1056  NA 142  K 4.9  CL 104  CO2 25  GLUCOSE 96  BUN 14  CREATININE  0.75  CALCIUM 9.7  GFRNONAA 86  GFRAA 99    HEMOGLOBIN A1C Lab Results  Component Value Date   HGBA1C 5.8 (H) 04/05/2020    IMPRESSION:    ICD-10-CM   1. Nonspecific abnormal electrocardiogram (ECG) (EKG)  R94.31 EKG 12-Lead    PCV ECHOCARDIOGRAM COMPLETE    2. Pure hypercholesterolemia  E78.00     3. Prediabetes  R73.03     4. Former smoker  Z87.891     5. Class 3 severe obesity due to excess calories without serious comorbidity with body mass index (BMI) of 40.0 to 44.9 in adult [facility])  E66.01    Z68.41        RECOMMENDATIONS: Christean Silvestri is a 65 y.o. female whose past medical history and cardiac risk factors include: Prediabetes, hyperlipidemia, former smoker, obesity due to excess calories.  Patient presents to the office for evaluation and management of her prior EKGs.  Prior EKGs independently reviewed which notes normal sinus rhythm with left axis and left anterior fascicular block.  No findings to suggest underlying ischemia or  injury pattern.  Clinically patient is chest pain-free and overall euvolemic.  Patient has multiple cardiovascular risk factors including hyperlipidemia, prediabetes, and obesity.  Patient states that these labs were performed prior to her losing 20 to 25 pounds that was needed to undergo knee replacement.  She is scheduled to have repeat blood work with her PCP.  And is encouraged to be started on pharmacological therapy if clinically warranted.  She is motivated to increase her physical activity as tolerated as she is now status post knee replacement and is focusing on weight loss and healthy living.  From a cardiovascular standpoint would recommend an echocardiogram to evaluate her LVEF as well as valvular heart disease.  If her echocardiogram is overall unremarkable I will see her on a as needed basis otherwise sooner.  Patient is agreeable with the plan of care.  She is very thankful for the care and attention provided to her during this encounter.  FINAL MEDICATION LIST END OF ENCOUNTER: No orders of the defined types were placed in this encounter.   There are no discontinued medications.   Current Outpatient Medications:    gabapentin (NEURONTIN) 300 MG capsule, Take 1 capsule by mouth daily at 12 noon., Disp: , Rfl:    methocarbamol (ROBAXIN) 500 MG tablet, Take 500 mg by mouth every 6 (six) hours as needed., Disp: , Rfl:    omeprazole (PRILOSEC) 20 MG capsule, Take 20 mg by mouth daily., Disp: , Rfl:   Orders Placed This Encounter  Procedures   EKG 12-Lead   PCV ECHOCARDIOGRAM COMPLETE    There are no Patient Instructions on file for this visit.   --Continue cardiac medications as reconciled in final medication list. --Return if symptoms worsen or fail to improve. Or sooner if needed. --Continue follow-up with your primary care physician regarding the management of your other chronic comorbid conditions.  Patient's questions and concerns were addressed to her satisfaction.  She voices understanding of the instructions provided during this encounter.   This note was created using a voice recognition software as a result there may be grammatical errors inadvertently enclosed that do not reflect the nature of this encounter. Every attempt is made to correct such errors.  Tessa Lerner, [place], [facility]  Pager: [phone] Office: [phone]

## 2020-11-28 ENCOUNTER — Ambulatory Visit: Payer: Managed Care, Other (non HMO)

## 2020-11-28 DIAGNOSIS — R9431 Abnormal electrocardiogram [ECG] [EKG]: Secondary | ICD-10-CM

## 2020-11-30 ENCOUNTER — Other Ambulatory Visit: Payer: Self-pay | Admitting: Critical Care Medicine

## 2020-11-30 ENCOUNTER — Other Ambulatory Visit: Payer: Self-pay | Admitting: Family Medicine

## 2020-11-30 DIAGNOSIS — Z1231 Encounter for screening mammogram for malignant neoplasm of breast: Secondary | ICD-10-CM

## 2020-12-18 ENCOUNTER — Encounter: Payer: Self-pay | Admitting: Family Medicine

## 2020-12-18 ENCOUNTER — Ambulatory Visit (INDEPENDENT_AMBULATORY_CARE_PROVIDER_SITE_OTHER): Payer: Managed Care, Other (non HMO) | Admitting: Family Medicine

## 2020-12-18 ENCOUNTER — Other Ambulatory Visit: Payer: Self-pay

## 2020-12-18 VITALS — BP 128/70 | HR 75 | Temp 98.2°F | Resp 16 | Ht 63.0 in | Wt 228.8 lb

## 2020-12-18 DIAGNOSIS — Z0000 Encounter for general adult medical examination without abnormal findings: Secondary | ICD-10-CM

## 2020-12-18 DIAGNOSIS — Z1329 Encounter for screening for other suspected endocrine disorder: Secondary | ICD-10-CM | POA: Diagnosis not present

## 2020-12-18 DIAGNOSIS — Z23 Encounter for immunization: Secondary | ICD-10-CM

## 2020-12-18 DIAGNOSIS — R7303 Prediabetes: Secondary | ICD-10-CM

## 2020-12-18 DIAGNOSIS — Z130 Encounter for screening for diseases of the blood and blood-forming organs and certain disorders involving the immune mechanism: Secondary | ICD-10-CM

## 2020-12-18 DIAGNOSIS — E785 Hyperlipidemia, unspecified: Secondary | ICD-10-CM | POA: Diagnosis not present

## 2020-12-18 LAB — COMPREHENSIVE METABOLIC PANEL
ALT: 7 U/L (ref 0–35)
AST: 12 U/L (ref 0–37)
Albumin: 4.2 g/dL (ref 3.5–5.2)
Alkaline Phosphatase: 72 U/L (ref 39–117)
BUN: 13 mg/dL (ref 6–23)
CO2: 24 mEq/L (ref 19–32)
Calcium: 9.5 mg/dL (ref 8.4–10.5)
Chloride: 105 mEq/L (ref 96–112)
Creatinine, Ser: 0.6 mg/dL (ref 0.40–1.20)
GFR: 95.89 mL/min (ref >60.00)
Glucose, Bld: 89 mg/dL (ref 70–99)
Potassium: 4.3 mEq/L (ref 3.5–5.1)
Sodium: 140 mEq/L (ref 135–145)
Total Bilirubin: 0.4 mg/dL (ref 0.2–1.2)
Total Protein: 6.7 g/dL (ref 6.0–8.3)

## 2020-12-18 LAB — LIPID PANEL
Cholesterol: 209 mg/dL — ABNORMAL HIGH (ref 0–200)
HDL: 53.5 mg/dL (ref >39.00)
LDL Cholesterol: 133 mg/dL — ABNORMAL HIGH (ref 0–99)
NonHDL: 155.29
Total CHOL/HDL Ratio: 4
Triglycerides: 110 mg/dL (ref 0.0–149.0)
VLDL: 22 mg/dL (ref 0.0–40.0)

## 2020-12-18 LAB — HEMOGLOBIN A1C: Hgb A1c MFr Bld: 5.7 % (ref 4.6–6.5)

## 2020-12-18 LAB — TSH: TSH: 1.73 u[IU]/mL (ref 0.35–5.50)

## 2020-12-18 NOTE — Progress Notes (Signed)
Subjective:  Patient ID: Teresa Strong, female    DOB: Nov 07, 1955  Age: 65 y.o. MRN: [id number]  CC:  Chief Complaint  Patient presents with   Annual Exam    Pt here today for physical, no concerns     HPI [name] presents for   Annual physical exam.  Last seen in June for preop exam.  Few borderline abnormalities on EKG at that time, referred to cardiology.  Evaluated August 22.  Dr. Terri Skains.  EKG with left axis and left anterior fascicular block.Echocardiogram August 23 with normal global wall motion, EF 40%, normal diastolic filling pattern and no significant valvular abnormalities.  As needed follow-up with cardiology.   9 weeks out from knee surgery - still soreness at time but doing ok, s/p PT. Happy she had surgery.   GERD: Omeprazole 4m qd.breakthrough sx's off meds.   [facility] 12/18/2020 09/15/2019 03/21/2017 07/18/2016  Decreased Interest - 0 0 0  Down, Depressed, Hopeless 0 0 0 0  PHQ - 2 Score 0 0 0 0  Altered sleeping 0 - - -  Tired, decreased energy 0 - - -  Change in appetite 0 - - -  Feeling bad or failure about yourself  0 - - -  Trouble concentrating 0 - - -  Moving slowly or fidgety/restless 0 - - -  Suicidal thoughts 0 - - -  PHQ-9 Score 0 - - -    Cancer Screening: Colon: has met with GI, plans to reschedule.last one in 2011.  Mammogram 01/05/20, next appt 01/05/21 Pap testing: scheduling through gynecology. 12/22/2015.   Immunization History  Administered Date(s) Administered   PFIZER(Purple Top)SARS-COV-2 Vaccination 08/18/2019, 09/08/2019   Tdap 08/24/2012  Shingles vaccine: recommended - will have today.  Declines flu vaccine.  Covid vaccine - primary series 2021. No booster. Booster - recommended bivalent booster.   Vision Screening   Right eye Left eye Both eyes  Without correction     With correction 20/20-1 20/30 20/15-1  Optho in past year - March 2023 next appt.   Dental: every 6 months.    Alcohol:none  Tobacco:none  Exercise/obesity/preDm Weight decreasing from June. Proud of weight maintenance after surgery.  Exercise with PT 2 times per week, home exercises for knee.  Sugar containing beverages - rare sweet tea.  Fast food/takeout: 2-3 times per week. Prior Weight [employer].  Working from home.  Lab Results  Component Value Date   HGBA1C 5.8 (H) 04/05/2020     Wt Readings from Last 3 Encounters:  12/18/20 228 lb 12.8 oz (103.8 kg)  11/27/20 230 lb 6.4 oz (104.5 kg)  10/05/20 233 lb (105.7 kg)   Hyperlipidemia: Some weight loss as above, no statin in past.  No early FH of heart disease.  The 10-year ASCVD risk score (Arnett DK, et al., 2019) is: 4.4%   Values used to calculate the score:     Age: [age]years     Sex: Female     Is Non-Hispanic African American: No     Diabetic: No     Tobacco smoker: No     Systolic Blood Pressure: 1973mmHg     Is BP treated: No     HDL Cholesterol: 54 mg/dL     Total Cholesterol: 224 mg/dL  Lab Results  Component Value Date   CHOL 224 (H) 04/05/2020   HDL 54 04/05/2020   LDLCALC 155 (H) 04/05/2020   TRIG 85 04/05/2020   CHOLHDL 4.1 04/05/2020  Lab Results  Component Value Date   ALT 8 04/05/2020   AST 11 04/05/2020   ALKPHOS 93 04/05/2020   BILITOT 0.3 04/05/2020     Body mass index is 40.53 kg/m.  Lab Results  Component Value Date   CHOL 224 (H) 04/05/2020   HDL 54 04/05/2020   LDLCALC 155 (H) 04/05/2020   TRIG 85 04/05/2020   CHOLHDL 4.1 04/05/2020      History Patient Active Problem List   Diagnosis Date Noted   Bell's palsy 06/14/2013   GERD (gastroesophageal reflux disease) 09/22/2012   BMI 40.0-44.9, adult ([place]) 09/22/2012   Left leg pain 09/03/2012   Left leg swelling 09/03/2012   Past Medical History:  Diagnosis Date   Arthritis    Bell's palsy    GERD (gastroesophageal reflux disease)    Hyperlipidemia    Obesity    Prediabetes    Past Surgical History:  Procedure  Laterality Date   ABDOMINAL HYSTERECTOMY     TOTAL KNEE ARTHROPLASTY Right    No Known Allergies Prior to Admission medications   Medication Sig Start Date End Date Taking? Authorizing Provider  gabapentin (NEURONTIN) 300 MG capsule Take 1 capsule by mouth daily at 12 noon. 10/14/20   [provider]  methocarbamol (ROBAXIN) 500 MG tablet Take 500 mg by mouth every 6 (six) hours as needed. 11/02/20   [provider]  omeprazole (PRILOSEC) 20 MG capsule Take 20 mg by mouth daily.    [provider]   Social History   Socioeconomic History   Marital status: Divorced    Spouse name: Not on file   Number of children: Not on file   Years of education: Not on file   Highest education level: Not on file  Occupational History   Not on file  Tobacco Use   Smoking status: Former    Packs/day: 0.25    Years: 3.00    Pack years: 0.75    Types: Cigarettes    Quit date: 28    Years since quitting: 42.7   Smokeless tobacco: Never  Vaping Use   Vaping Use: Never used  Substance and Sexual Activity   Alcohol use: Not Currently    Alcohol/week: 0.0 standard drinks    Comment: social   Drug use: No   Sexual activity: Yes    Birth control/protection: Surgical    Comment: number of sex partners in the last 12 months 1  Other Topics Concern   Not on file  Social History Narrative   Not on file   Social Determinants of Health   Financial Resource Strain: Not on file  Food Insecurity: Not on file  Transportation Needs: Not on file  Physical Activity: Not on file  Stress: Not on file  Social Connections: Not on file  Intimate Partner Violence: Not on file    Review of Systems   Objective:   Vitals:   12/18/20 0812  BP: 128/70  Pulse: 75  Resp: 16  Temp: 98.2 F (36.8 C)  TempSrc: Temporal  SpO2: 99%  Weight: 228 lb 12.8 oz (103.8 kg)  Height: '5\' 3"'  (1.6 m)     Physical Exam Vitals reviewed.  Constitutional:      Appearance: She is  well-developed.     Comments: Overweight/obese.   HENT:     Head: Normocephalic and atraumatic.     Right Ear: External ear normal.     Left Ear: External ear normal.  Eyes:     Conjunctiva/sclera:  Conjunctivae normal.     Pupils: Pupils are equal, round, and reactive to light.  Neck:     Thyroid: No thyromegaly.  Cardiovascular:     Rate and Rhythm: Normal rate and regular rhythm.     Heart sounds: Normal heart sounds. No murmur heard. Pulmonary:     Effort: Pulmonary effort is normal. No respiratory distress.     Breath sounds: Normal breath sounds. No wheezing.  Abdominal:     General: Bowel sounds are normal.     Palpations: Abdomen is soft.     Tenderness: There is no abdominal tenderness.  Musculoskeletal:        General: No tenderness. Normal range of motion.     Cervical back: Normal range of motion and neck supple.     Comments: Well healed scar anterior R knee.   Lymphadenopathy:     Cervical: No cervical adenopathy.  Skin:    General: Skin is warm and dry.     Findings: No rash.  Neurological:     Mental Status: She is alert and oriented to person, place, and time.  Psychiatric:        Behavior: Behavior normal.        Thought Content: Thought content normal.     Assessment & Plan:  Courteny Egler is a 65 y.o. female . Annual physical exam - Plan: Comprehensive metabolic panel, Lipid panel, Hemoglobin A1c  - -anticipatory guidance as below in AVS, screening labs above. Health maintenance items as above in HPI discussed/recommended as applicable.  GI and gynecology follow up discussed.   Prediabetes - Plan: Comprehensive metabolic panel, Hemoglobin A1c  - commended on weight loss approaches and efforts. Other resources discussed if needed.   Hyperlipidemia, unspecified hyperlipidemia type - Plan: Comprehensive metabolic panel, Lipid panel  - check labs.    Need for shingles vaccine - Plan: Varicella-zoster vaccine IM  Screening for thyroid disorder - Plan:  TSH   No orders of the defined types were placed in this encounter.  Patient Instructions  Keep up the good work with weight loss! Keep follow up with gastroenterology as planned for colonoscopy and gynecology for pap testing.  2nd shingles vaccine in 23month.  If any concerns on labs I will let you know. Thanks for coming in today.    Preventive Care [id number] Old, Female Preventive care refers to lifestyle choices and visits with your health care provider that can promote health and wellness. This includes: A yearly physical exam. This is also called an annual wellness visit. Regular dental and eye exams. Immunizations. Screening for certain conditions. Healthy lifestyle choices, such as: Eating a healthy diet. Getting regular exercise. Not using drugs or products that contain nicotine and tobacco. Limiting alcohol use. What can I expect for my preventive care visit? Physical exam Your health care provider will check your: Height and weight. These may be used to calculate your BMI (body mass index). BMI is a measurement that tells if you are at a healthy weight. Heart rate and blood pressure. Body temperature. Skin for abnormal spots. Counseling Your health care provider may ask you questions about your: Past medical problems. Family's medical history. Alcohol, tobacco, and drug use. Emotional well-being. Home life and relationship well-being. Sexual activity. Diet, exercise, and sleep habits. Work and work [employer] Access to firearms. Method of birth control. Menstrual cycle. Pregnancy history. What immunizations do I need? Vaccines are usually given at various ages, according to a schedule. Your health care provider will recommend vaccines  for you based on your age, medical history, and lifestyle or other factors, such as travel or where you work. What tests do I need? Blood tests Lipid and cholesterol levels. These may be checked every 5 years, or more often  if you are over 32 years old. Hepatitis C test. Hepatitis B test. Screening Lung cancer screening. You may have this screening every year starting at age 9 if you have a 30-pack-year history of smoking and currently smoke or have quit within the past 15 years. Colorectal cancer screening. All adults should have this screening starting at age 21 and continuing until age 13. Your health care provider may recommend screening at age 32 if you are at increased risk. You will have tests every 1-10 years, depending on your results and the type of screening test. Diabetes screening. This is done by checking your blood sugar (glucose) after you have not eaten for a while (fasting). You may have this done every 1-3 years. Mammogram. This may be done every 1-2 years. Talk with your health care provider about when you should start having regular mammograms. This may depend on whether you have a family history of breast cancer. BRCA-related cancer screening. This may be done if you have a family history of breast, ovarian, tubal, or peritoneal cancers. Pelvic exam and Pap test. This may be done every 3 years starting at age 93. Starting at age 37, this may be done every 5 years if you have a Pap test in combination with an HPV test. Other tests STD (sexually transmitted disease) testing, if you are at risk. Bone density scan. This is done to screen for osteoporosis. You may have this scan if you are at high risk for osteoporosis. Talk with your health care provider about your test results, treatment options, and if necessary, the need for more tests. Follow these instructions at home: Eating and drinking  Eat a diet that includes fresh fruits and vegetables, whole grains, lean protein, and low-fat dairy products. Take vitamin and mineral supplements as recommended by your health care provider. Do not drink alcohol if: Your health care provider tells you not to drink. You are pregnant, may be  pregnant, or are planning to become pregnant. If you drink alcohol: Limit how much you have to 0-1 drink a day. Be aware of how much alcohol is in your drink. In the [place] one drink equals one 12 oz bottle of beer (355 mL), one 5 oz glass of wine (148 mL), or one 1 oz glass of hard liquor (44 mL). Lifestyle Take daily care of your teeth and gums. Brush your teeth every morning and night with fluoride toothpaste. Floss one time each day. Stay active. Exercise for at least 30 minutes 5 or more days each week. Do not use any products that contain nicotine or tobacco, such as cigarettes, e-cigarettes, and chewing tobacco. If you need help quitting, ask your health care provider. Do not use drugs. If you are sexually active, practice safe sex. Use a condom or other form of protection to prevent STIs (sexually transmitted infections). If you do not wish to become pregnant, use a form of birth control. If you plan to become pregnant, see your health care provider for a prepregnancy visit. If told by your health care provider, take low-dose aspirin daily starting at age 65. Find healthy ways to cope with stress, such as: Meditation, yoga, or listening to music. Journaling. Talking to a trusted person. Spending time with friends and family.  Safety Always wear your seat belt while driving or riding in a vehicle. Do not drive: If you have been drinking alcohol. Do not ride with someone who has been drinking. When you are tired or distracted. While texting. Wear a helmet and other protective equipment during sports activities. If you have firearms in your house, make sure you follow all gun safety procedures. What's next? Visit your health care provider once a year for an annual wellness visit. Ask your health care provider how often you should have your eyes and teeth checked. Stay up to date on all vaccines. This information is not intended to replace advice given to you by your health care  provider. Make sure you discuss any questions you have with your health care provider. Document Revised: 06/02/2020 Document Reviewed: 12/04/2017 Elsevier Patient Education  2022 [place],   Merri Ray, MD [place], [facility] 12/18/20 8:33 AM

## 2020-12-18 NOTE — Patient Instructions (Addendum)
Keep up the good work with weight loss! Keep follow up with gastroenterology as planned for colonoscopy and gynecology for pap testing.  2nd shingles vaccine in 42month.  If any concerns on labs I will let you know. Thanks for coming in today.    Preventive Care [age]Years Old, Female Preventive care refers to lifestyle choices and visits with your health care provider that can promote health and wellness. This includes: A yearly physical exam. This is also called an annual wellness visit. Regular dental and eye exams. Immunizations. Screening for certain conditions. Healthy lifestyle choices, such as: Eating a healthy diet. Getting regular exercise. Not using drugs or products that contain nicotine and tobacco. Limiting alcohol use. What can I expect for my preventive care visit? Physical exam Your health care provider will check your: Height and weight. These may be used to calculate your BMI (body mass index). BMI is a measurement that tells if you are at a healthy weight. Heart rate and blood pressure. Body temperature. Skin for abnormal spots. Counseling Your health care provider may ask you questions about your: Past medical problems. Family's medical history. Alcohol, tobacco, and drug use. Emotional well-being. Home life and relationship well-being. Sexual activity. Diet, exercise, and sleep habits. Work and work [employer] Access to firearms. Method of birth control. Menstrual cycle. Pregnancy history. What immunizations do I need? Vaccines are usually given at various ages, according to a schedule. Your health care provider will recommend vaccines for you based on your age, medical history, and lifestyle or other factors, such as travel or where you work. What tests do I need? Blood tests Lipid and cholesterol levels. These may be checked every 5 years, or more often if you are over 526years old. Hepatitis C test. Hepatitis B test. Screening Lung cancer  screening. You may have this screening every year starting at age 61369if you have a 30-pack-year history of smoking and currently smoke or have quit within the past 15 years. Colorectal cancer screening. All adults should have this screening starting at age 61317and continuing until age 63 Your health care provider may recommend screening at age 6472if you are at increased risk. You will have tests every 1-10 years, depending on your results and the type of screening test. Diabetes screening. This is done by checking your blood sugar (glucose) after you have not eaten for a while (fasting). You may have this done every 1-3 years. Mammogram. This may be done every 1-2 years. Talk with your health care provider about when you should start having regular mammograms. This may depend on whether you have a family history of breast cancer. BRCA-related cancer screening. This may be done if you have a family history of breast, ovarian, tubal, or peritoneal cancers. Pelvic exam and Pap test. This may be done every 3 years starting at age 618 Starting at age 63 this may be done every 5 years if you have a Pap test in combination with an HPV test. Other tests STD (sexually transmitted disease) testing, if you are at risk. Bone density scan. This is done to screen for osteoporosis. You may have this scan if you are at high risk for osteoporosis. Talk with your health care provider about your test results, treatment options, and if necessary, the need for more tests. Follow these instructions at home: Eating and drinking  Eat a diet that includes fresh fruits and vegetables, whole grains, lean protein, and low-fat dairy products. Take vitamin and mineral supplements as  recommended by your health care provider. Do not drink alcohol if: Your health care provider tells you not to drink. You are pregnant, may be pregnant, or are planning to become pregnant. If you drink alcohol: Limit how much you have to  0-1 drink a day. Be aware of how much alcohol is in your drink. In the [place] one drink equals one 12 oz bottle of beer (355 mL), one 5 oz glass of wine (148 mL), or one 1 oz glass of hard liquor (44 mL). Lifestyle Take daily care of your teeth and gums. Brush your teeth every morning and night with fluoride toothpaste. Floss one time each day. Stay active. Exercise for at least 30 minutes 5 or more days each week. Do not use any products that contain nicotine or tobacco, such as cigarettes, e-cigarettes, and chewing tobacco. If you need help quitting, ask your health care provider. Do not use drugs. If you are sexually active, practice safe sex. Use a condom or other form of protection to prevent STIs (sexually transmitted infections). If you do not wish to become pregnant, use a form of birth control. If you plan to become pregnant, see your health care provider for a prepregnancy visit. If told by your health care provider, take low-dose aspirin daily starting at age 21. Find healthy ways to cope with stress, such as: Meditation, yoga, or listening to music. Journaling. Talking to a trusted person. Spending time with friends and family. Safety Always wear your seat belt while driving or riding in a vehicle. Do not drive: If you have been drinking alcohol. Do not ride with someone who has been drinking. When you are tired or distracted. While texting. Wear a helmet and other protective equipment during sports activities. If you have firearms in your house, make sure you follow all gun safety procedures. What's next? Visit your health care provider once a year for an annual wellness visit. Ask your health care provider how often you should have your eyes and teeth checked. Stay up to date on all vaccines. This information is not intended to replace advice given to you by your health care provider. Make sure you discuss any questions you have with your health care provider. Document  Revised: 06/02/2020 Document Reviewed: 12/04/2017 Elsevier Patient Education  2022 [employer].

## 2021-01-05 DIAGNOSIS — Z1231 Encounter for screening mammogram for malignant neoplasm of breast: Secondary | ICD-10-CM

## 2021-02-01 ENCOUNTER — Other Ambulatory Visit: Payer: Self-pay

## 2021-02-01 ENCOUNTER — Ambulatory Visit
Admission: RE | Admit: 2021-02-01 | Discharge: 2021-02-01 | Disposition: A | Discharge status: Home / Self Care | Payer: Managed Care, Other (non HMO) | Source: Ambulatory Visit | Attending: Family Medicine | Admitting: Family Medicine

## 2021-02-01 DIAGNOSIS — Z1231 Encounter for screening mammogram for malignant neoplasm of breast: Secondary | ICD-10-CM

## 2021-06-18 ENCOUNTER — Ambulatory Visit: Payer: Managed Care, Other (non HMO)

## 2021-06-22 ENCOUNTER — Ambulatory Visit: Payer: Managed Care, Other (non HMO)

## 2021-06-25 ENCOUNTER — Ambulatory Visit: Payer: Managed Care, Other (non HMO)

## 2021-08-01 ENCOUNTER — Ambulatory Visit: Payer: Managed Care, Other (non HMO) | Admitting: Registered Nurse

## 2021-08-06 ENCOUNTER — Ambulatory Visit: Payer: Managed Care, Other (non HMO) | Admitting: Registered Nurse

## 2021-12-19 ENCOUNTER — Other Ambulatory Visit: Payer: Self-pay | Admitting: Family Medicine

## 2021-12-19 DIAGNOSIS — Z1231 Encounter for screening mammogram for malignant neoplasm of breast: Secondary | ICD-10-CM

## 2021-12-24 ENCOUNTER — Ambulatory Visit: Payer: Managed Care, Other (non HMO) | Admitting: Family Medicine

## 2021-12-26 ENCOUNTER — Telehealth (INDEPENDENT_AMBULATORY_CARE_PROVIDER_SITE_OTHER): Payer: Managed Care, Other (non HMO) | Admitting: Family Medicine

## 2021-12-26 ENCOUNTER — Encounter: Payer: Self-pay | Admitting: Family Medicine

## 2021-12-26 ENCOUNTER — Other Ambulatory Visit: Payer: Self-pay

## 2021-12-26 VITALS — Temp 101.0°F | Wt 235.0 lb

## 2021-12-26 DIAGNOSIS — U071 COVID-19: Secondary | ICD-10-CM | POA: Diagnosis not present

## 2021-12-26 DIAGNOSIS — R051 Acute cough: Secondary | ICD-10-CM | POA: Diagnosis not present

## 2021-12-26 MED ORDER — NIRMATRELVIR/RITONAVIR (PAXLOVID)TABLET: 3.0000 | ORAL_TABLET | Freq: Two times a day (BID) | ORAL | 0 refills | Status: AC

## 2021-12-26 MED ORDER — BENZONATATE 100 MG PO CAPS: 100.0000 mg | ORAL_CAPSULE | Freq: Three times a day (TID) | ORAL | 0 refills | Status: DC | PRN

## 2021-12-26 NOTE — Progress Notes (Signed)
Virtual Visit via Video Note  I connected with Teresa Strong on 12/26/21 at 9:41 AM by a video enabled telemedicine application and verified that I am speaking with the correct person using two identifiers.  Patient location: home, by self.  My location: office - [place].    I discussed the limitations, risks, security and privacy concerns of performing an evaluation and management service by telephone and the availability of in person appointments. I also discussed with the patient that there may be a patient responsible charge related to this service. The patient expressed understanding and agreed to proceed, consent obtained  Chief complaint:  Chief Complaint  Patient presents with   Covid Positive    Patient states she had a sore throat on Monday and woke up Tuesday feeling horrible. Patient states she has been experiencing coughing , congestion, headaches, runny nose, fever of 101, ears clogged, vomiting and overall feels like she has got hit by a truck patient said she has not took any medications at this time     History of Present Illness: Teresa Strong is a 66 y.o. female  Covid 64 infection: Initial symptoms 2 days ago on 12/24/2021, sore/scratchy throat.  Felt worse yesterday with cough, congestion, headache, runny nose, fever 101 yesterday and today. Chills less today. Ears feel clogged (clogged prior, then improved, now clogged again). No pain.  Drinking fluids, cough with posttussive emesis only - phlegm only. No other vomitus. Less appetite, but urinating normally and sufficient amounts. Feeling minimally better today with less chills, still feels weak.  No chest pain or dyspnea, no confusion.  First time getting covid.  Positive test: yesterday.   Attempted treatments: none  Has received covid vaccine and booster last year.  Normal EGFR prior - 95.89 on 12/2020.   Immunization History  Administered Date(s) Administered   PFIZER(Purple Top)SARS-COV-2  Vaccination 08/18/2019, 09/08/2019   Tdap 08/24/2012   Zoster Recombinat (Shingrix) 12/18/2020     Patient Active Problem List   Diagnosis Date Noted   Bell's palsy 06/14/2013   GERD (gastroesophageal reflux disease) 09/22/2012   BMI 40.0-44.9, adult ([place]) 09/22/2012   Left leg pain 09/03/2012   Left leg swelling 09/03/2012   Past Medical History:  Diagnosis Date   Arthritis    Bell's palsy    GERD (gastroesophageal reflux disease)    Hyperlipidemia    Obesity    Prediabetes    Past Surgical History:  Procedure Laterality Date   ABDOMINAL HYSTERECTOMY     TOTAL KNEE ARTHROPLASTY Right    No Known Allergies Prior to Admission medications   Medication Sig Start Date End Date Taking? Authorizing Provider  omeprazole (PRILOSEC) 20 MG capsule Take 20 mg by mouth daily.   Yes [provider]   Social History   Socioeconomic History   Marital status: Divorced    Spouse name: Not on file   Number of children: Not on file   Years of education: Not on file   Highest education level: Not on file  Occupational History   Not on file  Tobacco Use   Smoking status: Former    Packs/day: 0.25    Years: 3.00    Total pack years: 0.75    Types: Cigarettes    Quit date: 35    Years since quitting: 43.7   Smokeless tobacco: Never  Vaping Use   Vaping Use: Never used  Substance and Sexual Activity   Alcohol use: Not Currently    Alcohol/week: 0.0 standard  drinks of alcohol    Comment: social   Drug use: No   Sexual activity: Yes    Birth control/protection: Surgical    Comment: number of sex partners in the last 12 months 1  Other Topics Concern   Not on file  Social History Narrative   Not on file   Social Determinants of Health   Financial Resource Strain: Not on file  Food Insecurity: Not on file  Transportation Needs: Not on file  Physical Activity: Not on file  Stress: Not on file  Social Connections: Not on file  Intimate Partner Violence: Not on  file    Observations/Objective: Vitals:   12/26/21 0920  Temp: (!) 101 F (38.3 C)  Weight: 235 lb (106.6 kg)  Nontoxic appearance on video, speaking in full sentences without respiratory distress.  No audible wheeze.  No stridor.  Coherent responses, euthymic mood, all questions were answered with understanding of plan expressed.  Assessment and Plan: COVID-19 virus infection  Acute cough - Plan: benzonatate (TESSALON) 100 MG capsule COVID-19 infection, day 2 of symptoms.  Slight improvement with chills today, no red flags on symptoms above.  We will continue outpatient treatment.  Symptomatic care discussed with Mucinex, Tylenol, fluids, rest.  Tessalon Perles as needed for cough.  We discussed risk and benefits as well as potential side effects of antivirals and she would like to take Paxlovid.  Ordered full dose.  Risk of rebound COVID discussed as well as treatment if that were to occur.  Quarantine and masking precautions discussed as well as ER precautions.  Handout given.  Follow Up Instructions:    Patient Instructions  Mucinex for cough. If needed, tessalon perles were prescribed. Tylenol for fever or body aches. Drink plenty of fluids, rest.  If any chest pain, shortness of breath, or confusion be seen in ER.  Paxlovid sent to pharmacy.   Your employer may have specific requirements for return to work, but this information is provided from the CDC. There is a link provided below for more information if needed.   Everyone who has presumed or confirmed COVID-19 should stay home and isolate from other people for at least 5 full days (day 0 is the first day of symptoms or the date of the day of the positive viral test for asymptomatic persons). You can end isolation after 5 full days if you are fever-free for 24 hours without the use of fever-reducing medication and your other symptoms have improved (Loss of taste and smell may persist for weeks or months after recovery and need not  delay the end of isolation). You should continue to wear a well-fitting mask around others at home and in public for 5 additional days (day 6 through day 10) after the end of your 5-day isolation period. If you are unable to wear a mask when around others, you should continue to isolate for a full 10 days. Avoid people who have weakened immune systems or are more likely to get very sick from COVID-19, and nursing homes and other high-risk settings, until after at least 10 days.  If you continue to have fever or your other symptoms have not improved after 5 days of isolation, you should wait to end your isolation until you are fever-free for 24 hours without the use of fever-reducing medication and your other symptoms have improved. Continue to wear a well-fitting mask through day 10.   https://brown.org/.html   I discussed the assessment and treatment plan with the patient. The  patient was provided an opportunity to ask questions and all were answered. The patient agreed with the plan and demonstrated an understanding of the instructions.   The patient was advised to call back or seek an in-person evaluation if the symptoms worsen or if the condition fails to improve as anticipated.   Wendie Agreste, MD

## 2021-12-26 NOTE — Patient Instructions (Addendum)
Mucinex for cough. If needed, tessalon perles were prescribed. Tylenol for fever or body aches. Drink plenty of fluids, rest.  If any chest pain, shortness of breath, or confusion be seen in ER.  Paxlovid sent to pharmacy.   Your employer may have specific requirements for return to work, but this information is provided from the CDC. There is a link provided below for more information if needed.   Everyone who has presumed or confirmed COVID-19 should stay home and isolate from other people for at least 5 full days (day 0 is the first day of symptoms or the date of the day of the positive viral test for asymptomatic persons). You can end isolation after 5 full days if you are fever-free for 24 hours without the use of fever-reducing medication and your other symptoms have improved (Loss of taste and smell may persist for weeks or months after recovery and need not delay the end of isolation). You should continue to wear a well-fitting mask around others at home and in public for 5 additional days (day 6 through day 10) after the end of your 5-day isolation period. If you are unable to wear a mask when around others, you should continue to isolate for a full 10 days. Avoid people who have weakened immune systems or are more likely to get very sick from COVID-19, and nursing homes and other high-risk settings, until after at least 10 days.  If you continue to have fever or your other symptoms have not improved after 5 days of isolation, you should wait to end your isolation until you are fever-free for 24 hours without the use of fever-reducing medication and your other symptoms have improved. Continue to wear a well-fitting mask through day 10.   https://brown.org/.html

## 2021-12-28 ENCOUNTER — Telehealth: Payer: Self-pay | Admitting: Family Medicine

## 2021-12-28 ENCOUNTER — Other Ambulatory Visit: Payer: Self-pay

## 2021-12-28 ENCOUNTER — Encounter (HOSPITAL_COMMUNITY): Payer: Self-pay

## 2021-12-28 ENCOUNTER — Emergency Department (HOSPITAL_COMMUNITY): Payer: Managed Care, Other (non HMO)

## 2021-12-28 ENCOUNTER — Emergency Department (HOSPITAL_COMMUNITY)
Admission: EM | Admit: 2021-12-28 | Discharge: 2021-12-28 | Disposition: A | Discharge status: Home / Self Care | Payer: Managed Care, Other (non HMO) | Attending: Emergency Medicine | Admitting: Emergency Medicine

## 2021-12-28 DIAGNOSIS — R0602 Shortness of breath: Secondary | ICD-10-CM | POA: Diagnosis present

## 2021-12-28 DIAGNOSIS — Z96651 Presence of right artificial knee joint: Secondary | ICD-10-CM | POA: Diagnosis not present

## 2021-12-28 DIAGNOSIS — Z87891 Personal history of nicotine dependence: Secondary | ICD-10-CM | POA: Diagnosis not present

## 2021-12-28 DIAGNOSIS — U071 COVID-19: Secondary | ICD-10-CM | POA: Diagnosis not present

## 2021-12-28 NOTE — ED Triage Notes (Signed)
Pt home test positive for covid on Tues. PCP gave rx's to take. Pt feels SOB and tired still.

## 2021-12-28 NOTE — Telephone Encounter (Signed)
Patient is sweating, having breathing issues, lightheaded and palpitations. Wasn't sure if it was side effects of the paxlovid. She has taken 6 paxlovid pills all together. 3 yesterday morning and 3 last night. She just wanted to see if she needs to be seen again or if this could be normal.I let her know that I would send this to Dr Carlota Raspberry and make sure this is addressed asap.   Teresa Strong, for example: This is the type of info that phone calls should be getting and always make sure we are getting correct information. In the original phone note it states she has taken ONE pill, she has taken one whole dose.   Thanks!

## 2021-12-28 NOTE — Telephone Encounter (Signed)
Caller name:Adeja [place]   On DPR? :yes/no: Yes  Call back number: [phone]  Provider they see: Carlota Raspberry  Reason for call: pt called stating that she has Covid. Pt had an mychart visit with Dr.Greene on 12/26/21. Pt states that she only took one pill of the Paxlovid and she till not feeling any better. Pt would like to talk with a nurse about what she should do next.

## 2021-12-28 NOTE — Telephone Encounter (Signed)
Please call patient.  If she is having breathing issues including shortness of breath at rest and continued lightheadedness or palpitations I recommend emergency room evaluation given COVID infection.  I would not expect the symptoms to be typical of the Paxlovid, more concerned about possible progression of her COVID infection.

## 2021-12-28 NOTE — ED Provider Notes (Signed)
[facility] EMERGENCY DEPARTMENT Provider Note  CSN: [id number] Arrival date & time: 12/28/21 6606  Chief Complaint(s) Shortness of Breath  HPI Teresa Strong is a 66 y.o. female with history of hyperlipidemia presenting to the emergency department with shortness of breath.  Patient reports mild shortness of breath worse today, associated with recent upper respiratory tract infection symptoms such as sore throat, runny nose, cough.  No nausea, vomiting, abdominal pain, diarrhea.  No chest pain.  Symptoms are worse with walking around.  She has been taking Paxlovid, Mucinex, Tessalon Perles without significant improvement.  No recent travel or surgeries.  No history of DVT or PE   Past Medical History Past Medical History:  Diagnosis Date   Arthritis    Bell's palsy    GERD (gastroesophageal reflux disease)    Hyperlipidemia    Obesity    Prediabetes    Patient Active Problem List   Diagnosis Date Noted   Bell's palsy 06/14/2013   GERD (gastroesophageal reflux disease) 09/22/2012   BMI 40.0-44.9, adult ([facility]) 09/22/2012   Left leg pain 09/03/2012   Left leg swelling 09/03/2012   Home Medication(s) Prior to Admission medications   Medication Sig Start Date End Date Taking? Authorizing Provider  benzonatate (TESSALON) 100 MG capsule Take 1 capsule (100 mg total) by mouth 3 (three) times daily as needed for cough. 12/26/21   Shade Flood, MD  nirmatrelvir/ritonavir EUA (PAXLOVID) 20 x 150 MG & 10 x 100MG  TABS Take 3 tablets by mouth 2 (two) times daily for 5 days. (Take nirmatrelvir 150 mg two tablets twice daily for 5 days and ritonavir 100 mg one tablet twice daily for 5 days) Patient GFR is 95 12/26/21 12/31/21  01/02/22, MD  omeprazole (PRILOSEC) 20 MG capsule Take 20 mg by mouth daily.    [provider]                                                                                                                                    Past Surgical History Past  Surgical History:  Procedure Laterality Date   ABDOMINAL HYSTERECTOMY     TOTAL KNEE ARTHROPLASTY Right    Family History Family History  Problem Relation Age of Onset   Cirrhosis Mother    Alcohol abuse Mother    Lung cancer Father    Diabetes Brother    Cancer Maternal Grandmother        breast   Breast cancer Maternal Grandmother    Breast cancer Maternal Grandfather     Social History Social History   Tobacco Use   Smoking status: Former    Packs/day: 0.25    Years: 3.00    Total pack years: 0.75    Types: Cigarettes    Quit date: 1980    Years since quitting: 43.7   Smokeless tobacco: Never  Vaping Use   Vaping Use: Never used  Substance Use  Topics   Alcohol use: Not Currently    Alcohol/week: 0.0 standard drinks of alcohol    Comment: social   Drug use: No   Allergies Patient has no known allergies.  Review of Systems Review of Systems  All other systems reviewed and are negative.   Physical Exam Vital Signs  I have reviewed the triage vital signs BP 121/71   Pulse 72   Temp 99.3 F (37.4 C) (Oral)   Resp 19   Ht 5\' 4"  (1.626 m)   Wt 105.2 kg   SpO2 99%   BMI 39.82 kg/m  Physical Exam Vitals and nursing note reviewed.  Constitutional:      General: She is not in acute distress.    Appearance: She is well-developed.  HENT:     Head: Normocephalic and atraumatic.     Mouth/Throat:     Mouth: Mucous membranes are moist.  Eyes:     Pupils: Pupils are equal, round, and reactive to light.  Cardiovascular:     Rate and Rhythm: Normal rate and regular rhythm.     Heart sounds: No murmur heard. Pulmonary:     Effort: Pulmonary effort is normal. No respiratory distress.     Breath sounds: Normal breath sounds.  Abdominal:     General: Abdomen is flat.     Palpations: Abdomen is soft.     Tenderness: There is no abdominal tenderness.  Musculoskeletal:        General: No tenderness.     Right lower leg: No edema.     Left lower leg: No  edema.  Skin:    General: Skin is warm and dry.  Neurological:     General: No focal deficit present.     Mental Status: She is alert. Mental status is at baseline.  Psychiatric:        Mood and Affect: Mood normal.        Behavior: Behavior normal.     ED Results and Treatments Labs (all labs ordered are listed, but only abnormal results are displayed) Labs Reviewed - No data to display                                                                                                                        Radiology DG Chest 1 View  Result Date: 12/28/2021 CLINICAL DATA:  Shortness of breath EXAM: CHEST  1 VIEW COMPARISON:  10/06/2020 FINDINGS: The heart size and mediastinal contours are within normal limits. Both lungs are clear. The visualized skeletal structures are unremarkable. IMPRESSION: No active disease. Electronically Signed   By: Rolm Baptise M.D.   On: 12/28/2021 11:02    Pertinent labs & imaging results that were available during my care of the patient were reviewed by me and considered in my medical decision making (see MDM for details).  Medications Ordered in ED Medications - No data to display  Procedures Procedures  (including critical care time)  Medical Decision Making / ED Course   MDM:  66 year old female presenting to the emergency department with shortness of breath.  Patient well-appearing, physical exam reassuring, lungs clear.  No tachycardia, hypoxia.  Vitals reassuring.  Suspect related to COVID, chest x-ray reassuring without evidence of pneumonia, pneumothorax.  Extremely low suspicion for PE with no hypoxia, tachycardia, chest pain.  Doubt ACS, ECG reassuring and no chest pain. Will discharge patient to home. All questions answered. Patient comfortable with plan of discharge. Return precautions discussed with patient  and specified on the after visit summary.         Additional history obtained: -External records from outside source obtained and reviewed including: Chart review including previous notes, labs, imaging, consultation notes    EKG   EKG Interpretation  Date/Time:  Friday December 28 2021 10:55:55 EDT Ventricular Rate:  86 PR Interval:  144 QRS Duration: 74 QT Interval:  368 QTC Calculation: 440 R Axis:   224 Text Interpretation: Normal sinus rhythm Right superior axis deviation Cannot rule out Anterior infarct , age undetermined Abnormal ECG No previous ECGs available Confirmed by Alvino Blood ([id number]) on 12/28/2021 11:36:50 AM         Imaging Studies ordered: I ordered imaging studies CXR On my interpretation imaging demonstrates clear lungs I independently visualized and interpreted imaging. I agree with the radiologist interpretation   Medicines ordered and prescription drug management: No orders of the defined types were placed in this encounter.   -I have reviewed the patients home medicines and have made adjustments as needed    Cardiac Monitoring: The patient was maintained on a cardiac monitor.  I personally viewed and interpreted the cardiac monitored which showed an underlying rhythm of: NSR  Social Determinants of Health:  Factors impacting patients care include: former smoker   Reevaluation: After the interventions noted above, I reevaluated the patient and found that they have improved  Co morbidities that complicate the patient evaluation  Past Medical History:  Diagnosis Date   Arthritis    Bell's palsy    GERD (gastroesophageal reflux disease)    Hyperlipidemia    Obesity    Prediabetes       Dispostion: Discharge    Final Clinical Impression(s) / ED Diagnoses Final diagnoses:  COVID-19     This chart was dictated using voice recognition software.  Despite best efforts to proofread,  errors can occur which can change the  documentation meaning.    Lonell Grandchild, MD 12/28/21 [phone]

## 2021-12-28 NOTE — ED Notes (Signed)
Paxlovid, tesslon pearls, and mucinex are the rx pcp gave pt.

## 2021-12-28 NOTE — Telephone Encounter (Signed)
Pt has been called, understands she is going to go to [facility] as it is near to her home.

## 2022-02-05 ENCOUNTER — Ambulatory Visit
Admission: RE | Admit: 2022-02-05 | Discharge: 2022-02-05 | Disposition: A | Discharge status: Home / Self Care | Payer: Managed Care, Other (non HMO) | Source: Ambulatory Visit | Attending: Family Medicine | Admitting: Family Medicine

## 2022-02-05 DIAGNOSIS — Z1231 Encounter for screening mammogram for malignant neoplasm of breast: Secondary | ICD-10-CM

## 2022-02-07 ENCOUNTER — Encounter: Payer: Self-pay | Admitting: Family Medicine

## 2022-02-07 ENCOUNTER — Ambulatory Visit: Payer: Managed Care, Other (non HMO) | Admitting: Family Medicine

## 2022-02-07 VITALS — BP 122/70 | HR 55 | Temp 97.5°F | Ht 64.0 in | Wt 244.2 lb

## 2022-02-07 DIAGNOSIS — R350 Frequency of micturition: Secondary | ICD-10-CM

## 2022-02-07 LAB — POCT URINALYSIS DIP (MANUAL ENTRY)
Bilirubin, UA: NEGATIVE
Blood, UA: NEGATIVE
Glucose, UA: NEGATIVE mg/dL
Ketones, POC UA: NEGATIVE mg/dL
Leukocytes, UA: NEGATIVE
Nitrite, UA: NEGATIVE
Protein Ur, POC: NEGATIVE mg/dL
Spec Grav, UA: 1.01 (ref 1.010–1.025)
Urobilinogen, UA: 2 E.U./dL — AB
pH, UA: 6 (ref 5.0–8.0)

## 2022-02-07 MED ORDER — CEPHALEXIN 500 MG PO CAPS: 500.0000 mg | ORAL_CAPSULE | Freq: Two times a day (BID) | ORAL | 0 refills | Status: AC

## 2022-02-07 NOTE — Progress Notes (Signed)
   Subjective:    Patient ID: Teresa Strong, female    DOB: Dec 06, 1955, 66 y.o.   MRN: [id number]  HPI 'i think I have a UTI'- reports yesterday her 'legs were killing me'.  Then developed urinary urgency.  Frequent urination overnight.  No dysuria, no blood in urine.  Urine is cloudy.  Developed pain in back and buttocks.  'i just don't feel good'.  No fevers.  No known back injury or change in activity level. Pt reports frequency and urgency are consistent w/ previous UTIs but the leg and hip pain is new.  She feels like the leg pain is more consistent w/ arthritis.  Leg aching improved w/ Tylenol    Review of Systems For ROS see HPI     Objective:   Physical Exam Vitals reviewed.  Constitutional:      General: She is not in acute distress.    Appearance: Normal appearance. She is not ill-appearing.  HENT:     Head: Normocephalic and atraumatic.  Pulmonary:     Effort: Pulmonary effort is normal.     Breath sounds: Normal breath sounds.  Abdominal:     General: There is no distension.     Palpations: Abdomen is soft.     Tenderness: There is no abdominal tenderness. There is no right CVA tenderness, left CVA tenderness, guarding or rebound.  Musculoskeletal:        General: No tenderness (no TTP over SI joints, sciatic notch, or greater trochanters).  Skin:    General: Skin is warm and dry.  Neurological:     General: No focal deficit present.     Mental Status: She is alert and oriented to person, place, and time.           Assessment & Plan:  Urinary frequency- new.  Pt reports urinary frequency and urgency are consistent w/ prior UTIs.  She reports she has never had leg/hip/buttock pain before and wonders if that is associated.  Pt reports pain improves w/ Tylenol and feels consistent w/ arthritis.  There was quite a cold front that moved in yesterday so it may be arthritis related b/c it does not seem to be related to urinary sxs.  UA WNL but given sxs, will send urine  for culture and start empiric abx.  Reviewed supportive care and red flags that should prompt return.  Pt expressed understanding and is in agreement w/ plan.

## 2022-02-07 NOTE — Patient Instructions (Addendum)
Follow up as needed START the Cephalexin twice daily x5 days Continue to drink LOTS of fluids to flush the bladder Call with any questions or concerns- particularly if symptoms don't improve or worsen Hang in there!!!

## 2022-02-09 LAB — URINE CULTURE
MICRO NUMBER:: 14136874
SPECIMEN QUALITY:: ADEQUATE

## 2022-02-11 ENCOUNTER — Ambulatory Visit: Payer: Managed Care, Other (non HMO) | Admitting: Family Medicine

## 2022-02-11 ENCOUNTER — Telehealth: Payer: Self-pay | Admitting: Family Medicine

## 2022-02-11 ENCOUNTER — Ambulatory Visit (HOSPITAL_BASED_OUTPATIENT_CLINIC_OR_DEPARTMENT_OTHER)
Admission: RE | Admit: 2022-02-11 | Discharge: 2022-02-11 | Disposition: A | Discharge status: Home / Self Care | Payer: Managed Care, Other (non HMO) | Source: Ambulatory Visit | Attending: Family Medicine | Admitting: Family Medicine

## 2022-02-11 ENCOUNTER — Encounter: Payer: Self-pay | Admitting: Family Medicine

## 2022-02-11 VITALS — BP 124/78 | HR 74 | Temp 97.4°F | Ht 64.0 in | Wt 242.4 lb

## 2022-02-11 DIAGNOSIS — M545 Low back pain, unspecified: Secondary | ICD-10-CM | POA: Insufficient documentation

## 2022-02-11 DIAGNOSIS — R195 Other fecal abnormalities: Secondary | ICD-10-CM | POA: Diagnosis not present

## 2022-02-11 DIAGNOSIS — R350 Frequency of micturition: Secondary | ICD-10-CM

## 2022-02-11 DIAGNOSIS — R1030 Lower abdominal pain, unspecified: Secondary | ICD-10-CM

## 2022-02-11 LAB — CBC
HCT: 37.3 % (ref 36.0–46.0)
Hemoglobin: 12.3 g/dL (ref 12.0–15.0)
MCHC: 32.9 g/dL (ref 30.0–36.0)
MCV: 89.7 fl (ref 78.0–100.0)
Platelets: 280 10*3/uL (ref 150.0–400.0)
RBC: 4.16 Mil/uL (ref 3.87–5.11)
RDW: 14.6 % (ref 11.5–15.5)
WBC: 6.9 10*3/uL (ref 4.0–10.5)

## 2022-02-11 LAB — POCT URINALYSIS DIP (MANUAL ENTRY)
Bilirubin, UA: NEGATIVE
Blood, UA: NEGATIVE
Glucose, UA: 100 mg/dL — AB
Ketones, POC UA: NEGATIVE mg/dL
Leukocytes, UA: NEGATIVE
Nitrite, UA: NEGATIVE
Protein Ur, POC: NEGATIVE mg/dL
Spec Grav, UA: 1.02 (ref 1.010–1.025)
Urobilinogen, UA: NEGATIVE E.U./dL — AB
pH, UA: 6 (ref 5.0–8.0)

## 2022-02-11 MED ORDER — IOHEXOL 300 MG/ML  SOLN
100.0000 mL | Freq: Once | INTRAMUSCULAR | Status: AC | PRN
  Administered 2022-02-11: 100 mL via INTRAVENOUS

## 2022-02-11 NOTE — Progress Notes (Unsigned)
Subjective:  Patient ID: Teresa Strong, female    DOB: Jan 28, 1956  Age: 66 y.o. MRN: [id number]  CC:  Chief Complaint  Patient presents with   Abdominal Pain    Pt is having pelvis pain, she had a dipstick urine and culture completed Thursday by Dr. Beverely Low and she didn't find anything, but pt is still having pain, no matter what she does she takes tylenol and it last 4 -6 hours but that's all. She states it almost feels like pressure pain, late Saturday she did have burning when urination     HPI Teresa Strong presents for   Pelvic pain: As above, seen by my colleague  4 days ago.  Note reviewed.  Possible arthritis.  Did report urinary frequency, urgency.  Urinalysis overall within normal limits but given symptoms culture sent and started empiric antibiotics with Keflex twice daily x5 days.  Tylenol as needed for pain.  She did notice the pain with the cold front that moved then, question of arthritis pain. Urine culture noted - mixed genital flora. No side effects with abx.   Reports initial frequent urination, but pain wrapped from back to abdomen both sides, and pain down front of legs.  Taking tylenol every 4-6 hours. Diarrhea started this am - loose stools - few episodes.  Still lower abdominal and back pain, no further leg pain.  No fever, no n/v. Less urinary frequency. Burning with urination few days ago - resolved. Still cloudy urine, no hematuria.  No vaginal bleeding or new discharge.  No history of diverticulitis. Few polyps in 2011 on colonoscopy. No known hx of diverticula.  Still taking omeprazole QD.   Tx: keflex, tylenol.    History Patient Active Problem List   Diagnosis Date Noted   Bell's palsy 06/14/2013   GERD (gastroesophageal reflux disease) 09/22/2012   BMI 40.0-44.9, adult (HCC) 09/22/2012   Past Medical History:  Diagnosis Date   Arthritis    Bell's palsy    GERD (gastroesophageal reflux disease)    Hyperlipidemia    Obesity    Prediabetes     Past Surgical History:  Procedure Laterality Date   ABDOMINAL HYSTERECTOMY     TOTAL KNEE ARTHROPLASTY Right    No Known Allergies Prior to Admission medications   Medication Sig Start Date End Date Taking? Authorizing Provider  acetaminophen (TYLENOL) 500 MG tablet Take 500 mg by mouth every 6 (six) hours as needed.   Yes [provider]  cephALEXin (KEFLEX) 500 MG capsule Take 1 capsule (500 mg total) by mouth 2 (two) times daily for 10 doses. 02/07/22 02/12/22 Yes Sheliah Hatch, MD  omeprazole (PRILOSEC) 20 MG capsule Take 20 mg by mouth daily.   Yes [provider]  Omeprazole Magnesium (PRILOSEC PO) omeprazole    [provider]   Social History   Socioeconomic History   Marital status: Divorced    Spouse name: Not on file   Number of children: Not on file   Years of education: Not on file   Highest education level: Not on file  Occupational History   Not on file  Tobacco Use   Smoking status: Former    Packs/day: 0.25    Years: 3.00    Total pack years: 0.75    Types: Cigarettes    Quit date: 80    Years since quitting: 43.8   Smokeless tobacco: Never  Vaping Use   Vaping Use: Never used  Substance and Sexual Activity  Alcohol use: Not Currently    Alcohol/week: 0.0 standard drinks of alcohol    Comment: social   Drug use: No   Sexual activity: Yes    Birth control/protection: Surgical    Comment: number of sex partners in the last 12 months 1  Other Topics Concern   Not on file  Social History Narrative   Not on file   Social Determinants of Health   Financial Resource Strain: Not on file  Food Insecurity: Not on file  Transportation Needs: Not on file  Physical Activity: Not on file  Stress: Not on file  Social Connections: Not on file  Intimate Partner Violence: Not on file    Review of Systems Per HPI.   Objective:   Vitals:   02/11/22 1332  BP: 124/78  Pulse: 74  Temp: (!) 97.4 F (36.3 C)  SpO2:  100%  Weight: 242 lb 6.4 oz (110 kg)  Height: 5\' 4"  (1.626 m)     Physical Exam Vitals reviewed.  Constitutional:      Appearance: Normal appearance. She is well-developed.  HENT:     Head: Normocephalic and atraumatic.  Eyes:     Conjunctiva/sclera: Conjunctivae normal.     Pupils: Pupils are equal, round, and reactive to light.  Neck:     Vascular: No carotid bruit.  Cardiovascular:     Rate and Rhythm: Normal rate and regular rhythm.     Heart sounds: Normal heart sounds.  Pulmonary:     Effort: Pulmonary effort is normal.     Breath sounds: Normal breath sounds.  Abdominal:     General: There is no distension.     Palpations: Abdomen is soft. There is no pulsatile mass.     Tenderness: There is abdominal tenderness (Diffuse lower abdomen without 1 focal area of tenderness). There is no right CVA tenderness, left CVA tenderness or guarding.  Musculoskeletal:     Right lower leg: No edema.     Left lower leg: No edema.     Comments: Lumbar spine nontender, no midline bony tenderness.  Negative seated straight leg raise with discomfort only in the back with left leg extension, no radicular symptoms.  Pain-free hip range of motion.  Skin:    General: Skin is warm and dry.  Neurological:     Mental Status: She is alert and oriented to person, place, and time.  Psychiatric:        Mood and Affect: Mood normal.        Behavior: Behavior normal.    Results for orders placed or performed in visit on 02/11/22  CBC  Result Value Ref Range   WBC 6.9 4.0 - 10.5 K/uL   RBC 4.16 3.87 - 5.11 Mil/uL   Platelets 280.0 150.0 - 400.0 K/uL   Hemoglobin 12.3 12.0 - 15.0 g/dL   HCT 13/06/23 42.7 - 06.2 %   MCV 89.7 78.0 - 100.0 fl   MCHC 32.9 30.0 - 36.0 g/dL   RDW 37.6 28.3 - 15.1 %  POCT urinalysis dipstick  Result Value Ref Range   Color, UA light yellow (A) yellow   Clarity, UA cloudy (A) clear   Glucose, UA =100 (A) negative mg/dL   Bilirubin, UA negative negative   Ketones,  POC UA negative negative mg/dL   Spec Grav, UA 76.1 6.073 - 1.025   Blood, UA negative negative   pH, UA 6.0 5.0 - 8.0   Protein Ur, POC negative negative mg/dL   Urobilinogen, UA  negative (A) 0.2 or 1.0 E.U./dL   Nitrite, UA Negative Negative   Leukocytes, UA Negative Negative     Assessment & Plan:  Teresa Strong is a 66 y.o. female . Acute bilateral low back pain, unspecified whether sciatica present - Plan: CBC, CT Abdomen Pelvis W Contrast  Lower abdominal pain - Plan: CBC, CT Abdomen Pelvis W Contrast  Loose stools - Plan: CBC, CT Abdomen Pelvis W Contrast  Urinary frequency - Plan: POCT urinalysis dipstick, Urine Culture  Initial onset of urinary frequency, low back pain radiating around abdomen bilaterally and into front of legs.  Question multiple different causes including possible flare of arthritis or muscular cause of low back pain with radicular symptoms.  Urinalysis overall reassuring last visit, minimal improvement with urinary frequency after Keflex.  We will repeat urinalysis and culture.  No further radicular symptoms, but still with lower abdominal pain, and some low back pain.  With urinary urgency/frequency and overall reassuring urinalysis question possible sigmoid diverticulitis as cause, especially with loose stools today.  No recent colonoscopy.  Check CBC, CT abdomen pelvis.  ER precautions given.  No orders of the defined types were placed in this encounter.  Patient Instructions  I have repeated the urine test and culture today as well as a blood count.  I am trying to also get a CT scan ordered to see if there may be an infection or inflammation in the intestines that could be causing some of your symptoms.  Once I see those results we will decide if further testing needed.  Return to the clinic or go to the nearest emergency room if any of your symptoms worsen or new symptoms occur.  Abdominal Pain, Adult Pain in the abdomen (abdominal pain) can be  caused by many things. Often, abdominal pain is not serious and it gets better with no treatment or by being treated at home. However, sometimes abdominal pain is serious. Your health care provider will ask questions about your medical history and do a physical exam to try to determine the cause of your abdominal pain. Follow these instructions at home: Medicines Take over-the-counter and prescription medicines only as told by your health care provider. Do not take a laxative unless told by your health care provider. General instructions  Watch your condition for any changes. Drink enough fluid to keep your urine pale yellow. Keep all follow-up visits as told by your health care provider. This is important. Contact a health care provider if: Your abdominal pain changes or gets worse. You are not hungry or you lose weight without trying. You are constipated or have diarrhea for more than 2-3 days. You have pain when you urinate or have a bowel movement. Your abdominal pain wakes you up at night. Your pain gets worse with meals, after eating, or with certain foods. You are vomiting and cannot keep anything down. You have a fever. You have blood in your urine. Get help right away if: Your pain does not go away as soon as your health care provider told you to expect. You cannot stop vomiting. Your pain is only in areas of the abdomen, such as the right side or the left lower portion of the abdomen. Pain on the right side could be caused by appendicitis. You have bloody or black stools, or stools that look like tar. You have severe pain, cramping, or bloating in your abdomen. You have signs of dehydration, such as: Dark urine, very little urine, or no urine. Cracked  lips. Dry mouth. Sunken eyes. Sleepiness. Weakness. You have trouble breathing or chest pain. Summary Often, abdominal pain is not serious and it gets better with no treatment or by being treated at home. However, sometimes  abdominal pain is serious. Watch your condition for any changes. Take over-the-counter and prescription medicines only as told by your health care provider. Contact a health care provider if your abdominal pain changes or gets worse. Get help right away if you have severe pain, cramping, or bloating in your abdomen. This information is not intended to replace advice given to you by your health care provider. Make sure you discuss any questions you have with your health care provider. Document Revised: 05/14/2019 Document Reviewed: 08/03/2018 Elsevier Patient Education  [place],   Merri Ray, MD [place] 02/11/22 2:08 PM

## 2022-02-11 NOTE — Telephone Encounter (Signed)
Caller name: Teresa Strong  On DPR?: Yes  Call back number: [phone] (mobile)  Provider they see: Wendie Agreste, MD  Reason for call:  Pt called stated that she did received Dr. Birdie Riddle message. Pt stated that she is still in pain. Pt was told to take tylenol for the pain, to states that the tylenol is hurting her stomach. Pt is currently having pelvis pain. Scheduled appt with Dr. Carlota Raspberry today.

## 2022-02-11 NOTE — Telephone Encounter (Signed)
FYI to Dr Carlota Raspberry about patient appointment today.   Update for Dr Birdie Riddle on patients condition

## 2022-02-11 NOTE — Patient Instructions (Signed)
I have repeated the urine test and culture today as well as a blood count.  I am trying to also get a CT scan ordered to see if there may be an infection or inflammation in the intestines that could be causing some of your symptoms.  Once I see those results we will decide if further testing needed.  Return to the clinic or go to the nearest emergency room if any of your symptoms worsen or new symptoms occur.  Abdominal Pain, Adult Pain in the abdomen (abdominal pain) can be caused by many things. Often, abdominal pain is not serious and it gets better with no treatment or by being treated at home. However, sometimes abdominal pain is serious. Your health care provider will ask questions about your medical history and do a physical exam to try to determine the cause of your abdominal pain. Follow these instructions at home: Medicines Take over-the-counter and prescription medicines only as told by your health care provider. Do not take a laxative unless told by your health care provider. General instructions  Watch your condition for any changes. Drink enough fluid to keep your urine pale yellow. Keep all follow-up visits as told by your health care provider. This is important. Contact a health care provider if: Your abdominal pain changes or gets worse. You are not hungry or you lose weight without trying. You are constipated or have diarrhea for more than 2-3 days. You have pain when you urinate or have a bowel movement. Your abdominal pain wakes you up at night. Your pain gets worse with meals, after eating, or with certain foods. You are vomiting and cannot keep anything down. You have a fever. You have blood in your urine. Get help right away if: Your pain does not go away as soon as your health care provider told you to expect. You cannot stop vomiting. Your pain is only in areas of the abdomen, such as the right side or the left lower portion of the abdomen. Pain on the right side  could be caused by appendicitis. You have bloody or black stools, or stools that look like tar. You have severe pain, cramping, or bloating in your abdomen. You have signs of dehydration, such as: Dark urine, very little urine, or no urine. Cracked lips. Dry mouth. Sunken eyes. Sleepiness. Weakness. You have trouble breathing or chest pain. Summary Often, abdominal pain is not serious and it gets better with no treatment or by being treated at home. However, sometimes abdominal pain is serious. Watch your condition for any changes. Take over-the-counter and prescription medicines only as told by your health care provider. Contact a health care provider if your abdominal pain changes or gets worse. Get help right away if you have severe pain, cramping, or bloating in your abdomen. This information is not intended to replace advice given to you by your health care provider. Make sure you discuss any questions you have with your health care provider. Document Revised: 05/14/2019 Document Reviewed: 08/03/2018 Elsevier Patient Education  [place].

## 2022-02-13 LAB — URINE CULTURE
MICRO NUMBER:: 14148937
Result:: NO GROWTH
SPECIMEN QUALITY:: ADEQUATE

## 2022-02-14 LAB — POCT I-STAT CREATININE: Creatinine, Ser: 0.6 mg/dL (ref 0.44–1.00)

## 2022-08-14 ENCOUNTER — Ambulatory Visit: Payer: Managed Care, Other (non HMO) | Admitting: Family Medicine

## 2022-08-14 ENCOUNTER — Encounter: Payer: Self-pay | Admitting: Family Medicine

## 2022-08-14 VITALS — BP 122/68 | HR 71 | Temp 98.0°F | Ht 64.0 in | Wt 244.0 lb

## 2022-08-14 DIAGNOSIS — M545 Low back pain, unspecified: Secondary | ICD-10-CM | POA: Diagnosis not present

## 2022-08-14 DIAGNOSIS — M79641 Pain in right hand: Secondary | ICD-10-CM | POA: Diagnosis not present

## 2022-08-14 DIAGNOSIS — M79642 Pain in left hand: Secondary | ICD-10-CM

## 2022-08-14 MED ORDER — MELOXICAM 7.5 MG PO TABS: 7.5000 mg | ORAL_TABLET | Freq: Every day | ORAL | 0 refills | Status: DC | PRN

## 2022-08-14 NOTE — Progress Notes (Signed)
Subjective:  Patient ID: Teresa Strong, female    DOB: 06-Mar-1956  Age: 67 y.o. MRN: [id number]  CC:  Chief Complaint  Patient presents with   Back Pain    Pt states that she has lower back/lower hip pain right side right above buttocks  OTC meds does not help    Hand Pain    Pt states pain in both hands, hard to open things      HPI Teresa Strong presents for   2 separate concerns as above  Back pain: Current symptoms started about a month or two ago. Felt like sciatica, only on R side above buttocks, no leg radiation. No known injury or preceding event. Not daily, some days pain free, other days with more activity causes more pain.  No relief with alleve, tylenol, back-aid with flares. Pain improved with rest overnight.  No bowel or bladder incontinence, no saddle anesthesia, no lower extremity weakness. No fever, night sweats, unexplained weight changes.  Saw chiropractor in 2207, 2011. No recent PT or chiropractic care.  CT abd/pelvis in 02/2022 - Musculoskeletal: Multilevel degenerative changes. No acute osseous abnormality.  Bilateral hand pain Past few weeks, no specific injury. Usually index fingers, shooting pain in index finger. Feels weak to grip at times. Hard to squeeze or open jars/gripping. Hurts in knuckles. No locking/triggering. Shooting pain.   No hx of PUD or CKD. On omeprazole for GERD.   History Patient Active Problem List   Diagnosis Date Noted   Bell's palsy 06/14/2013   GERD (gastroesophageal reflux disease) 09/22/2012   BMI 40.0-44.9, adult (HCC) 09/22/2012   Past Medical History:  Diagnosis Date   Arthritis    Bell's palsy    GERD (gastroesophageal reflux disease)    Hyperlipidemia    Obesity    Prediabetes    Past Surgical History:  Procedure Laterality Date   ABDOMINAL HYSTERECTOMY     TOTAL ABDOMINAL HYSTERECTOMY     TOTAL KNEE ARTHROPLASTY Right    No Known Allergies Prior to Admission medications   Medication Sig Start Date  End Date Taking? Authorizing Provider  acetaminophen (TYLENOL) 500 MG tablet Take 500 mg by mouth every 6 (six) hours as needed.   Yes [provider]  omeprazole (PRILOSEC) 20 MG capsule Take 20 mg by mouth daily.   Yes [provider]  Omeprazole Magnesium (PRILOSEC PO) omeprazole   Yes [provider]   Social History   Socioeconomic History   Marital status: Divorced    Spouse name: Not on file   Number of children: Not on file   Years of education: Not on file   Highest education level: 12th grade  Occupational History   Not on file  Tobacco Use   Smoking status: Former    Packs/day: 0.25    Years: 3.00    Additional pack years: 0.00    Total pack years: 0.75    Types: Cigarettes    Quit date: 66    Years since quitting: 44.3   Smokeless tobacco: Never  Vaping Use   Vaping Use: Never used  Substance and Sexual Activity   Alcohol use: Not Currently    Alcohol/week: 0.0 standard drinks of alcohol    Comment: social   Drug use: No   Sexual activity: Yes    Birth control/protection: Surgical    Comment: number of sex partners in the last 12 months 1  Other Topics Concern   Not on file  Social History Narrative  Not on file   Social Determinants of Health   Financial Resource Strain: Low Risk  (08/10/2022)   Overall Financial Resource Strain (CARDIA)    Difficulty of Paying Living Expenses: Not hard at all  Food Insecurity: No Food Insecurity (08/10/2022)   Hunger Vital Sign    Worried About Running Out of Food in the Last Year: Never true    Ran Out of Food in the Last Year: Never true  Transportation Needs: No Transportation Needs (08/10/2022)   PRAPARE - Administrator, Civil Service (Medical): No    Lack of Transportation (Non-Medical): No  Physical Activity: Unknown (08/10/2022)   Exercise Vital Sign    Days of Exercise per Week: 0 days    Minutes of Exercise per Session: Not on file  Stress: No Stress Concern Present  (08/10/2022)   [employer] of Occupational Health - Occupational Stress Questionnaire    Feeling of Stress : Not at all  Social Connections: Moderately Isolated (08/10/2022)   Social Connection and Isolation Panel [NHANES]    Frequency of Communication with Friends and Family: More than three times a week    Frequency of Social Gatherings with Friends and Family: More than three times a week    Attends Religious Services: 1 to 4 times per year    Active Member of [employer] or Organizations: No    Attends Engineer, structural: Not on file    Marital Status: Divorced  Intimate Partner Violence: Not on file    Review of Systems   Objective:   Vitals:   08/14/22 1614  BP: 122/68  Pulse: 71  Temp: 98 F (36.7 C)  TempSrc: Temporal  SpO2: 97%  Weight: 244 lb (110.7 kg)  Height: 5\' 4"  (1.626 m)     Physical Exam Constitutional:      General: She is not in acute distress.    Appearance: Normal appearance. She is well-developed.  HENT:     Head: Normocephalic and atraumatic.  Cardiovascular:     Rate and Rhythm: Normal rate.  Pulmonary:     Effort: Pulmonary effort is normal.  Musculoskeletal:     Comments: Lumbar spine, locates area of discomfort at the right SI joint and lower right paraspinals.  Minimal discomfort.  No midline bony tenderness.  Negative seated straight leg raise.  Able to heel and toe walk without difficulty.  Range of motion somewhat limited with lateral flexion and forward flexion.  Ambulating without assistive device.  Left hand, prominence/nodes at multiple finger DIPs.  Tender to palpation over the ring finger DIP but no appreciable warmth, focal swelling or redness.  Full range of motion of hands, phalanges.  Index finger nontender, intact strength,  Right hand, pinky finger DIP tender with prominence/nodes of multiple finger DIPs.  Index finger equal strength to left, intact strength, nontender.  Wrist nontender, pain-free range of motion of  both wrists.  Neurological:     Mental Status: She is alert and oriented to person, place, and time.  Psychiatric:        Mood and Affect: Mood normal.        Assessment & Plan:  Teresa Strong is a 67 y.o. female . Right-sided low back pain without sciatica, unspecified chronicity - Plan: meloxicam (MOBIC) 7.5 MG tablet, Ambulatory referral to Physical Therapy   -Intermittent low back pain, suspect mechanical low back pain and likely flares of degenerative disease.  No concerning findings on exam or history.  No known injury.  Imaging deferred for now.  Short-term anti-inflammatory with meloxicam, potential side effects discussed.  Refer to PT, handout given on back pain and recheck in 6 weeks.  RTC precautions if worse or new symptoms sooner.  Bilateral hand pain - Plan: meloxicam (MOBIC) 7.5 MG tablet  -Likely some degenerative changes based on exam of DIPs.  Index fingers nontender, unable to reproduce pain on exam at present, I do not appreciate any significant weakness on exam at this time.  Hand pain possible flare of underlying arthritis.  Meloxicam as above should help, option to meet with hand specialist if not improving or recurrence of symptoms after completion of meloxicam.  She can send me a MyChart message regarding this plan.  Meds ordered this encounter  Medications   meloxicam (MOBIC) 7.5 MG tablet    Sig: Take 1 tablet (7.5 mg total) by mouth daily as needed for pain.    Dispense:  30 tablet    Refill:  0   Patient Instructions  Try meloxicam once per day for the next 2 weeks.  This should help wall pain and hand pain.  If hand pain returns after stopping meloxicam, let me know and I will send a referral to a hand specialist.  If you do not achieve any relief of the hand pain with the meloxicam, let me know sooner and I will put that referral in sooner.  I will refer you to physical therapy for the back pain, but again meloxicam over the next week or 2 should help.  Do  not combine that with other over-the-counter pain medicines other than Tylenol.  See other information below on treating back pain. Recheck in 6 weeks Return to the clinic or go to the nearest emergency room if any of your symptoms worsen or new symptoms occur.  Acute Back Pain, Adult Acute back pain is sudden and usually short-lived. It is often caused by an injury to the muscles and tissues in the back. The injury may result from: A muscle, tendon, or ligament getting overstretched or torn. Ligaments are tissues that connect bones to each other. Lifting something improperly can cause a back strain. Wear and tear (degeneration) of the spinal disks. Spinal disks are circular tissue that provide cushioning between the bones of the spine (vertebrae). Twisting motions, such as while playing sports or doing yard work. A hit to the back. Arthritis. You may have a physical exam, lab tests, and imaging tests to find the cause of your pain. Acute back pain usually goes away with rest and home care. Follow these instructions at home: Managing pain, stiffness, and swelling Take over-the-counter and prescription medicines only as told by your health care provider. Treatment may include medicines for pain and inflammation that are taken by mouth or applied to the skin, or muscle relaxants. Your health care provider may recommend applying ice during the first 24-48 hours after your pain starts. To do this: Put ice in a plastic bag. Place a towel between your skin and the bag. Leave the ice on for 20 minutes, 2-3 times a day. Remove the ice if your skin turns bright red. This is very important. If you cannot feel pain, heat, or cold, you have a greater risk of damage to the area. If directed, apply heat to the affected area as often as told by your health care provider. Use the heat source that your health care provider recommends, such as a moist heat pack or a heating pad. Place a towel between  your skin  and the heat source. Leave the heat on for 20-30 minutes. Remove the heat if your skin turns bright red. This is especially important if you are unable to feel pain, heat, or cold. You have a greater risk of getting burned. Activity  Do not stay in bed. Staying in bed for more than 1-2 days can delay your recovery. Sit up and stand up straight. Avoid leaning forward when you sit or hunching over when you stand. If you work at a desk, sit close to it so you do not need to lean over. Keep your chin tucked in. Keep your neck drawn back, and keep your elbows bent at a 90-degree angle (right angle). Sit high and close to the steering wheel when you drive. Add lower back (lumbar) support to your car seat, if needed. Take short walks on even surfaces as soon as you are able. Try to increase the length of time you walk each day. Do not sit, drive, or stand in one place for more than 30 minutes at a time. Sitting or standing for long periods of time can put stress on your back. Do not drive or use heavy machinery while taking prescription pain medicine. Use proper lifting techniques. When you bend and lift, use positions that put less stress on your back: [place] your knees. Keep the load close to your body. Avoid twisting. Exercise regularly as told by your health care provider. Exercising helps your back heal faster and helps prevent back injuries by keeping muscles strong and flexible. Work with a physical therapist to make a safe exercise program, as recommended by your health care provider. Do any exercises as told by your physical therapist. Lifestyle Maintain a healthy weight. Extra weight puts stress on your back and makes it difficult to have good posture. Avoid activities or situations that make you feel anxious or stressed. Stress and anxiety increase muscle tension and can make back pain worse. Learn ways to manage anxiety and stress, such as through exercise. General instructions Sleep on a  firm mattress in a comfortable position. Try lying on your side with your knees slightly bent. If you lie on your back, put a pillow under your knees. Keep your head and neck in a straight line with your spine (neutral position) when using electronic equipment like smartphones or pads. To do this: Raise your smartphone or pad to look at it instead of bending your head or neck to look down. Put the smartphone or pad at the level of your face while looking at the screen. Follow your treatment plan as told by your health care provider. This may include: Cognitive or behavioral therapy. Acupuncture or massage therapy. Meditation or yoga. Contact a health care provider if: You have pain that is not relieved with rest or medicine. You have increasing pain going down into your legs or buttocks. Your pain does not improve after 2 weeks. You have pain at night. You lose weight without trying. You have a fever or chills. You develop nausea or vomiting. You develop abdominal pain. Get help right away if: You develop new bowel or bladder control problems. You have unusual weakness or numbness in your arms or legs. You feel faint. These symptoms may represent a serious problem that is an emergency. Do not wait to see if the symptoms will go away. Get medical help right away. Call your local emergency services (911 in the [place] Do not drive yourself to the hospital. Summary Acute back pain  is sudden and usually short-lived. Use proper lifting techniques. When you bend and lift, use positions that put less stress on your back. Take over-the-counter and prescription medicines only as told by your health care provider, and apply heat or ice as told. This information is not intended to replace advice given to you by your health care provider. Make sure you discuss any questions you have with your health care provider. Document Revised: 06/16/2020 Document Reviewed: 06/16/2020 Elsevier Patient Education   2023 Elsevier Inc.     Signed,   [name], MD [facility], [facility] 08/14/22 5:06 PM

## 2022-08-14 NOTE — Patient Instructions (Signed)
Try meloxicam once per day for the next 2 weeks.  This should help wall pain and hand pain.  If hand pain returns after stopping meloxicam, let me know and I will send a referral to a hand specialist.  If you do not achieve any relief of the hand pain with the meloxicam, let me know sooner and I will put that referral in sooner.  I will refer you to physical therapy for the back pain, but again meloxicam over the next week or 2 should help.  Do not combine that with other over-the-counter pain medicines other than Tylenol.  See other information below on treating back pain. Recheck in 6 weeks Return to the clinic or go to the nearest emergency room if any of your symptoms worsen or new symptoms occur.  Acute Back Pain, Adult Acute back pain is sudden and usually short-lived. It is often caused by an injury to the muscles and tissues in the back. The injury may result from: A muscle, tendon, or ligament getting overstretched or torn. Ligaments are tissues that connect bones to each other. Lifting something improperly can cause a back strain. Wear and tear (degeneration) of the spinal disks. Spinal disks are circular tissue that provide cushioning between the bones of the spine (vertebrae). Twisting motions, such as while playing sports or doing yard work. A hit to the back. Arthritis. You may have a physical exam, lab tests, and imaging tests to find the cause of your pain. Acute back pain usually goes away with rest and home care. Follow these instructions at home: Managing pain, stiffness, and swelling Take over-the-counter and prescription medicines only as told by your health care provider. Treatment may include medicines for pain and inflammation that are taken by mouth or applied to the skin, or muscle relaxants. Your health care provider may recommend applying ice during the first 24-48 hours after your pain starts. To do this: Put ice in a plastic bag. Place a towel between your skin and  the bag. Leave the ice on for 20 minutes, 2-3 times a day. Remove the ice if your skin turns bright red. This is very important. If you cannot feel pain, heat, or cold, you have a greater risk of damage to the area. If directed, apply heat to the affected area as often as told by your health care provider. Use the heat source that your health care provider recommends, such as a moist heat pack or a heating pad. Place a towel between your skin and the heat source. Leave the heat on for 20-30 minutes. Remove the heat if your skin turns bright red. This is especially important if you are unable to feel pain, heat, or cold. You have a greater risk of getting burned. Activity  Do not stay in bed. Staying in bed for more than 1-2 days can delay your recovery. Sit up and stand up straight. Avoid leaning forward when you sit or hunching over when you stand. If you work at a desk, sit close to it so you do not need to lean over. Keep your chin tucked in. Keep your neck drawn back, and keep your elbows bent at a 90-degree angle (right angle). Sit high and close to the steering wheel when you drive. Add lower back (lumbar) support to your car seat, if needed. Take short walks on even surfaces as soon as you are able. Try to increase the length of time you walk each day. Do not sit, drive, or stand in  one place for more than 30 minutes at a time. Sitting or standing for long periods of time can put stress on your back. Do not drive or use heavy machinery while taking prescription pain medicine. Use proper lifting techniques. When you bend and lift, use positions that put less stress on your back: [place] your knees. Keep the load close to your body. Avoid twisting. Exercise regularly as told by your health care provider. Exercising helps your back heal faster and helps prevent back injuries by keeping muscles strong and flexible. Work with a physical therapist to make a safe exercise program, as recommended by  your health care provider. Do any exercises as told by your physical therapist. Lifestyle Maintain a healthy weight. Extra weight puts stress on your back and makes it difficult to have good posture. Avoid activities or situations that make you feel anxious or stressed. Stress and anxiety increase muscle tension and can make back pain worse. Learn ways to manage anxiety and stress, such as through exercise. General instructions Sleep on a firm mattress in a comfortable position. Try lying on your side with your knees slightly bent. If you lie on your back, put a pillow under your knees. Keep your head and neck in a straight line with your spine (neutral position) when using electronic equipment like smartphones or pads. To do this: Raise your smartphone or pad to look at it instead of bending your head or neck to look down. Put the smartphone or pad at the level of your face while looking at the screen. Follow your treatment plan as told by your health care provider. This may include: Cognitive or behavioral therapy. Acupuncture or massage therapy. Meditation or yoga. Contact a health care provider if: You have pain that is not relieved with rest or medicine. You have increasing pain going down into your legs or buttocks. Your pain does not improve after 2 weeks. You have pain at night. You lose weight without trying. You have a fever or chills. You develop nausea or vomiting. You develop abdominal pain. Get help right away if: You develop new bowel or bladder control problems. You have unusual weakness or numbness in your arms or legs. You feel faint. These symptoms may represent a serious problem that is an emergency. Do not wait to see if the symptoms will go away. Get medical help right away. Call your local emergency services (911 in the [place] Do not drive yourself to the hospital. Summary Acute back pain is sudden and usually short-lived. Use proper lifting techniques. When you  bend and lift, use positions that put less stress on your back. Take over-the-counter and prescription medicines only as told by your health care provider, and apply heat or ice as told. This information is not intended to replace advice given to you by your health care provider. Make sure you discuss any questions you have with your health care provider. Document Revised: 06/16/2020 Document Reviewed: 06/16/2020 Elsevier Patient Education  2023 [employer].

## 2022-08-29 ENCOUNTER — Telehealth: Payer: Self-pay | Admitting: Family Medicine

## 2022-08-29 NOTE — Telephone Encounter (Signed)
Referral has been open again, it was closed because pt never returned phone call to schedule an appt. They have mailed her a letter for scheduling.

## 2022-08-29 NOTE — Addendum Note (Signed)
Addended by: Eldred Manges on: 08/29/2022 02:33 PM   Modules accepted: Orders

## 2022-08-29 NOTE — Telephone Encounter (Signed)
Reordered referral, can we resend or send to new PT so she may be seen

## 2022-09-11 ENCOUNTER — Other Ambulatory Visit: Payer: Self-pay | Admitting: Family Medicine

## 2022-09-11 DIAGNOSIS — M79641 Pain in right hand: Secondary | ICD-10-CM

## 2022-09-11 DIAGNOSIS — M545 Low back pain, unspecified: Secondary | ICD-10-CM

## 2022-09-11 NOTE — Telephone Encounter (Signed)
Single refill given, but should use this medication short-term, only as long as needed, preferably only a few weeks at a time.  Follow-up if any further refills needed.  Thanks

## 2022-09-11 NOTE — Telephone Encounter (Signed)
Pt states she is still taking the medication listed below for right side pain . Can we refill?

## 2022-09-25 ENCOUNTER — Ambulatory Visit: Payer: Managed Care, Other (non HMO) | Admitting: Family Medicine

## 2022-10-07 ENCOUNTER — Encounter (HOSPITAL_COMMUNITY): Payer: Self-pay | Admitting: Physical Therapy

## 2022-10-07 ENCOUNTER — Ambulatory Visit (HOSPITAL_COMMUNITY): Payer: Managed Care, Other (non HMO) | Attending: Family Medicine | Admitting: Physical Therapy

## 2022-10-07 ENCOUNTER — Other Ambulatory Visit: Payer: Self-pay

## 2022-10-07 DIAGNOSIS — R2689 Other abnormalities of gait and mobility: Secondary | ICD-10-CM | POA: Diagnosis present

## 2022-10-07 DIAGNOSIS — M545 Low back pain, unspecified: Secondary | ICD-10-CM | POA: Diagnosis not present

## 2022-10-07 DIAGNOSIS — M25551 Pain in right hip: Secondary | ICD-10-CM | POA: Insufficient documentation

## 2022-10-07 DIAGNOSIS — R29898 Other symptoms and signs involving the musculoskeletal system: Secondary | ICD-10-CM | POA: Insufficient documentation

## 2022-10-07 DIAGNOSIS — M5459 Other low back pain: Secondary | ICD-10-CM | POA: Diagnosis present

## 2022-10-07 DIAGNOSIS — M6281 Muscle weakness (generalized): Secondary | ICD-10-CM | POA: Insufficient documentation

## 2022-10-07 NOTE — Therapy (Signed)
OUTPATIENT PHYSICAL THERAPY THORACOLUMBAR EVALUATION   Patient Name: Teresa Strong MRN: [id number] DOB:May 20, 1955, 67 y.o., female Today's Date: 10/07/2022  END OF SESSION:  PT End of Session - 10/07/22 1032     Visit Number 1    Number of Visits 12    Date for PT Re-Evaluation 11/18/22    Authorization Type [employer] Managed    PT Start Time 1032    PT Stop Time 1114    PT Time Calculation (min) 42 min    Activity Tolerance Patient tolerated treatment well    Behavior During Therapy WFL for tasks assessed/performed             Past Medical History:  Diagnosis Date   Arthritis    Bell's palsy    GERD (gastroesophageal reflux disease)    Hyperlipidemia    Obesity    Prediabetes    Past Surgical History:  Procedure Laterality Date   ABDOMINAL HYSTERECTOMY     TOTAL ABDOMINAL HYSTERECTOMY     TOTAL KNEE ARTHROPLASTY Right    Patient Active Problem List   Diagnosis Date Noted   Bell's palsy 06/14/2013   GERD (gastroesophageal reflux disease) 09/22/2012   BMI 40.0-44.9, adult (HCC) 09/22/2012    PCP: Shade Flood, MD  REFERRING PROVIDER: Shade Flood, MD  REFERRING DIAG: M54.50 (ICD-10-CM) - Right-sided low back pain without sciatica, unspecified chronicity  Rationale for Evaluation and Treatment: Rehabilitation  THERAPY DIAG:  Pain in right hip  Other low back pain  Muscle weakness (generalized)  Other abnormalities of gait and mobility  Other symptoms and signs involving the musculoskeletal system  ONSET DATE: 6 months  SUBJECTIVE:                                                                                                                                                                                           SUBJECTIVE STATEMENT: Patient states low back pain that began about 6 months ago with insidious onset. She doesn't have any symptoms in her leg. Symptoms were not constant or every day. Meloxicam has helped symptoms. Symptoms were  not bothering her over the weekend. Symptoms come and go. Had R knee surgery in the past and abdominal pain last year. Patient states symptoms increase with transitions in/out of low car, increase in activity all day. Symptoms decrease with meds and rest. R TKA 2 years ago. Moved into new house in Jan. 2024 with steep driveway and stairs which she was not used to doing.   PERTINENT HISTORY:  Chronic back pain, HLD, increased BMI  PAIN:  Are you having pain? Yes: NPRS scale: 5/10 Pain location:  R LBP/glute Pain description: stabbing Aggravating factors: in/out of low car, increased activity Relieving factors: med, rest  PRECAUTIONS: None  WEIGHT BEARING RESTRICTIONS: No  FALLS: Has patient fallen in last 6 months? No  OCCUPATION: Geographical information systems officer   PLOF: Independent  PATIENT GOALS: back to feeling better   OBJECTIVE:   PATIENT SURVEYS:  FOTO 63% function  SCREENING FOR RED FLAGS: Bowel or bladder incontinence: No Spinal tumors: No Cauda equina syndrome: No Compression fracture: No Abdominal aneurysm: No  COGNITION: Overall cognitive status: Within functional limits for tasks assessed     SENSATION: WFL  POSTURE: rounded shoulders, forward head, increased thoracic kyphosis, and anterior pelvic tilt  PALPATION: Hypomobile thoracic spine, tender lumbar CPA; TTP R glute max proximally, bilateral glute med/min and piriformis  LUMBAR ROM:   AROM eval  Flexion 25% limited  Extension 50% limited  Right lateral flexion 25% limited *  Left lateral flexion 25% limited *  Right rotation 0% limited  Left rotation 0% limited   (Blank rows = not tested)  LOWER EXTREMITY ROM:   decreased R hip extension PROM and AROM  Active  Right eval Left eval  Hip flexion    Hip extension    Hip abduction    Hip adduction    Hip internal rotation    Hip external rotation    Knee flexion    Knee extension    Ankle dorsiflexion    Ankle plantarflexion    Ankle inversion     Ankle eversion     (Blank rows = not tested)  LOWER EXTREMITY MMT:    MMT Right eval Left eval  Hip flexion 4+* 5  Hip extension 4- 4-  Hip abduction 4- 4-  Hip adduction    Hip internal rotation    Hip external rotation    Knee flexion 5 5  Knee extension 5 5  Ankle dorsiflexion 5 5  Ankle plantarflexion    Ankle inversion    Ankle eversion     (Blank rows = not tested)    FUNCTIONAL TESTS:  5 times sit to stand: 12.14 seconds without UE use, Relies LLE >RLE   GAIT: Distance walked: 100 feet Assistive device utilized: None Level of assistance: Complete Independence Comments: WFL  TODAY'S TREATMENT:                                                                                                                              DATE:  10/07/22 Standing lumbar extension 1 x 10  Bridge 1x 10    PATIENT EDUCATION:  Education details: Patient educated on exam findings, POC, scope of PT, HEP. Person educated: Patient Education method: Explanation, Demonstration, and Handouts Education comprehension: verbalized understanding, returned demonstration, verbal cues required, and tactile cues required  HOME EXERCISE PROGRAM: Access Code: [email or web address] URL: [email or web address]  Date: 10/07/2022 - Standing Lumbar Extension  - 5 x daily - 7 x weekly - 1 sets - 10 reps - Supine  Bridge  - 2 x daily - 7 x weekly - 1-2 sets - 10 reps  ASSESSMENT:  CLINICAL IMPRESSION: Patient a 67 y.o. y.o. female who was seen today for physical therapy evaluation and treatment for LBP/R hip pain. Patient presents with pain limited deficits in R hip/lumbar spine strength, ROM, endurance, activity tolerance, and functional mobility with ADL. Patient is having to modify and restrict ADL as indicated by outcome measure score as well as subjective information and objective measures which is affecting overall participation. Patient will benefit from skilled physical therapy in order to improve  function and reduce impairment.  OBJECTIVE IMPAIRMENTS: Abnormal gait, decreased activity tolerance, decreased balance, decreased endurance, decreased mobility, difficulty walking, decreased ROM, decreased strength, increased muscle spasms, impaired flexibility, improper body mechanics, postural dysfunction, and pain.   ACTIVITY LIMITATIONS: carrying, lifting, bending, standing, squatting, stairs, transfers, hygiene/grooming, locomotion level, and caring for others  PARTICIPATION LIMITATIONS: meal prep, cleaning, laundry, shopping, community activity, and yard work  PERSONAL FACTORS: Time since onset of injury/illness/exacerbation and 3+ comorbidities: Chronic back pain, HLD, increased BMI  are also affecting patient's functional outcome.   REHAB POTENTIAL: Good  CLINICAL DECISION MAKING: Stable/uncomplicated  EVALUATION COMPLEXITY: Low   GOALS: Goals reviewed with patient? Yes  SHORT TERM GOALS: Target date: 10/28/2022    Patient will be independent with HEP in order to improve functional outcomes. Baseline:  Goal status: INITIAL  2.  Patient will report at least 25% improvement in symptoms for improved quality of life. Baseline:  Goal status: INITIAL    LONG TERM GOALS: Target date: 11/18/2022    Patient will report at least 75% improvement in symptoms for improved quality of life. Baseline:  Goal status: INITIAL  2.  Patient will improve FOTO score by at least 5 points in order to indicate improved tolerance to activity. Baseline: 63% function Goal status: INITIAL  3.  Patient will demonstrate at least 25% improvement in lumbar ROM in all restricted planes for improved ability to move trunk while completing chores. Baseline: see above Goal status: INITIAL  4.  Patient will be able to complete 5x STS in under 11.4 seconds in order to reduce the risk of falls. Baseline: 12.14 seconds without UE use, Relies LLE >RLE Goal status: INITIAL  5.  Patient will demonstrate  grade of 5/5 MMT grade in all tested musculature as evidence of improved strength to assist with stair ambulation and gait.   Baseline: see above Goal status: INITIAL     PLAN:  PT FREQUENCY: 2x/week  PT DURATION: 6 weeks  PLANNED INTERVENTIONS: Therapeutic exercises, Therapeutic activity, Neuromuscular re-education, Balance training, Gait training, Patient/Family education, Joint manipulation, Joint mobilization, Stair training, Orthotic/Fit training, DME instructions, Aquatic Therapy, Dry Needling, Electrical stimulation, Spinal manipulation, Spinal mobilization, Cryotherapy, Moist heat, Compression bandaging, scar mobilization, Splintting, Taping, Traction, Ultrasound, Ionotophoresis 4mg /ml Dexamethasone, and Manual therapy  PLAN FOR NEXT SESSION: core and hip strength, hip mobility, functional strength   Reola Mosher Nickolaos Brallier, PT 10/07/2022, 11:26 AM

## 2022-10-09 ENCOUNTER — Encounter (HOSPITAL_COMMUNITY): Payer: Self-pay | Admitting: Physical Therapy

## 2022-10-09 ENCOUNTER — Ambulatory Visit (HOSPITAL_COMMUNITY): Payer: Managed Care, Other (non HMO) | Admitting: Physical Therapy

## 2022-10-09 DIAGNOSIS — M25551 Pain in right hip: Secondary | ICD-10-CM | POA: Diagnosis not present

## 2022-10-09 DIAGNOSIS — M6281 Muscle weakness (generalized): Secondary | ICD-10-CM

## 2022-10-09 DIAGNOSIS — M5459 Other low back pain: Secondary | ICD-10-CM

## 2022-10-09 DIAGNOSIS — R29898 Other symptoms and signs involving the musculoskeletal system: Secondary | ICD-10-CM

## 2022-10-09 DIAGNOSIS — R2689 Other abnormalities of gait and mobility: Secondary | ICD-10-CM

## 2022-10-09 NOTE — Therapy (Signed)
OUTPATIENT PHYSICAL THERAPY TREATMENT   Patient Name: LAQUAN CUTCHER MRN: [id number] DOB:January 17, 1956, 67 y.o., female Today's Date: 10/09/2022  END OF SESSION:  PT End of Session - 10/09/22 0906     Visit Number 2    Number of Visits 12    Date for PT Re-Evaluation 11/18/22    Authorization Type [employer] Managed    PT Start Time [phone]    PT Stop Time 0944    PT Time Calculation (min) 38 min    Activity Tolerance Patient tolerated treatment well    Behavior During Therapy [facility] for tasks assessed/performed             Past Medical History:  Diagnosis Date   Arthritis    Bell's palsy    GERD (gastroesophageal reflux disease)    Hyperlipidemia    Obesity    Prediabetes    Past Surgical History:  Procedure Laterality Date   ABDOMINAL HYSTERECTOMY     TOTAL ABDOMINAL HYSTERECTOMY     TOTAL KNEE ARTHROPLASTY Right    Patient Active Problem List   Diagnosis Date Noted   Bell's palsy 06/14/2013   GERD (gastroesophageal reflux disease) 09/22/2012   BMI 40.0-44.9, adult (HCC) 09/22/2012    PCP: Shade Flood, MD  REFERRING PROVIDER: Shade Flood, MD  REFERRING DIAG: M54.50 (ICD-10-CM) - Right-sided low back pain without sciatica, unspecified chronicity  Rationale for Evaluation and Treatment: Rehabilitation  THERAPY DIAG:  Pain in right hip  Other low back pain  Muscle weakness (generalized)  Other abnormalities of gait and mobility  Other symptoms and signs involving the musculoskeletal system  ONSET DATE: 6 months  SUBJECTIVE:                                                                                                                                                                                           SUBJECTIVE STATEMENT: Patient states yesterday was good until after work. Hard time getting in/out of chair at birthday event. Today, didn't feel it until she got into car.    EVAL: Patient states low back pain that began about 6 months ago  with insidious onset. She doesn't have any symptoms in her leg. Symptoms were not constant or every day. Meloxicam has helped symptoms. Symptoms were not bothering her over the weekend. Symptoms come and go. Had R knee surgery in the past and abdominal pain last year. Patient states symptoms increase with transitions in/out of low car, increase in activity all day. Symptoms decrease with meds and rest. R TKA 2 years ago. Moved into new house in Jan. 2024 with steep driveway and stairs which she  was not used to doing.   PERTINENT HISTORY:  Chronic back pain, HLD, increased BMI  PAIN:  Are you having pain? Yes: NPRS scale: 1/10 Pain location: R LBP/glute Pain description: stabbing Aggravating factors: in/out of low car, increased activity Relieving factors: med, rest  PRECAUTIONS: None  WEIGHT BEARING RESTRICTIONS: No  FALLS: Has patient fallen in last 6 months? No  OCCUPATION: Geographical information systems officer   PLOF: Independent  PATIENT GOALS: back to feeling better   OBJECTIVE:   PATIENT SURVEYS:  FOTO 63% function  SCREENING FOR RED FLAGS: Bowel or bladder incontinence: No Spinal tumors: No Cauda equina syndrome: No Compression fracture: No Abdominal aneurysm: No  COGNITION: Overall cognitive status: Within functional limits for tasks assessed     SENSATION: WFL  POSTURE: rounded shoulders, forward head, increased thoracic kyphosis, and anterior pelvic tilt  PALPATION: Hypomobile thoracic spine, tender lumbar CPA; TTP R glute max proximally, bilateral glute med/min and piriformis  LUMBAR ROM:   AROM eval  Flexion 25% limited  Extension 50% limited  Right lateral flexion 25% limited *  Left lateral flexion 25% limited *  Right rotation 0% limited  Left rotation 0% limited   (Blank rows = not tested)  LOWER EXTREMITY ROM:   decreased R hip extension PROM and AROM  Active  Right eval Left eval  Hip flexion    Hip extension    Hip abduction    Hip adduction    Hip  internal rotation    Hip external rotation    Knee flexion    Knee extension    Ankle dorsiflexion    Ankle plantarflexion    Ankle inversion    Ankle eversion     (Blank rows = not tested)  LOWER EXTREMITY MMT:    MMT Right eval Left eval  Hip flexion 4+* 5  Hip extension 4- 4-  Hip abduction 4- 4-  Hip adduction    Hip internal rotation    Hip external rotation    Knee flexion 5 5  Knee extension 5 5  Ankle dorsiflexion 5 5  Ankle plantarflexion    Ankle inversion    Ankle eversion     (Blank rows = not tested)    FUNCTIONAL TESTS:  5 times sit to stand: 12.14 seconds without UE use, Relies LLE >RLE   GAIT: Distance walked: 100 feet Assistive device utilized: None Level of assistance: Complete Independence Comments: WFL  TODAY'S TREATMENT:                                                                                                                              DATE:  10/09/22 Standing lumbar extension 1 x 10  Bridge 2x 10  DKTC with green ball under heels 1 x 10 with 5 second holds Supine piriformis stretch 3 x 20 second holds bilateral Supine ab set 1 x 10 x 5 second holds Supine ab set with march 2 x 10 bilateral SLR 1  x 10 bilateral  Standing lumbar extension 1 x 10   10/07/22 Standing lumbar extension 1 x 10  Bridge 1x 10    PATIENT EDUCATION:  Education details:10/09/22:HEP, posture, lumbar roll;  Eval: Patient educated on exam findings, POC, scope of PT, HEP. Person educated: Patient Education method: Explanation, Demonstration, and Handouts Education comprehension: verbalized understanding, returned demonstration, verbal cues required, and tactile cues required  HOME EXERCISE PROGRAM: Access Code: [email or web address] URL: [email or web address]  Date: 10/07/2022 - Standing Lumbar Extension  - 5 x daily - 7 x weekly - 1 sets - 10 reps - Supine Bridge  - 2 x daily - 7 x weekly - 1-2 sets - 10 reps  10/09/22 - Supine Piriformis Stretch with  Towel  - 1 x daily - 7 x weekly - 2 sets - 10 reps - Abdominal Bracing  - 1 x daily - 7 x weekly - 1 sets - 10 reps - 5 second hold - Supine March with Posterior Pelvic Tilt  - 1 x daily - 7 x weekly - 2 sets - 10 reps  ASSESSMENT:  CLINICAL IMPRESSION: Educated patient on use of towel roll at lumbar spine for sitting posture and lumbar support. Review of HEP with good mechanics. Began additional core/hip strengthening which is tolerated well. Cueing for TRA activation with good carry over. Discomfort following supine exercises which eases with additional reps of lumbar extension. Patient will continue to benefit from physical therapy in order to improve function and reduce impairment.   OBJECTIVE IMPAIRMENTS: Abnormal gait, decreased activity tolerance, decreased balance, decreased endurance, decreased mobility, difficulty walking, decreased ROM, decreased strength, increased muscle spasms, impaired flexibility, improper body mechanics, postural dysfunction, and pain.   ACTIVITY LIMITATIONS: carrying, lifting, bending, standing, squatting, stairs, transfers, hygiene/grooming, locomotion level, and caring for others  PARTICIPATION LIMITATIONS: meal prep, cleaning, laundry, shopping, community activity, and yard work  PERSONAL FACTORS: Time since onset of injury/illness/exacerbation and 3+ comorbidities: Chronic back pain, HLD, increased BMI  are also affecting patient's functional outcome.   REHAB POTENTIAL: Good  CLINICAL DECISION MAKING: Stable/uncomplicated  EVALUATION COMPLEXITY: Low   GOALS: Goals reviewed with patient? Yes  SHORT TERM GOALS: Target date: 10/28/2022    Patient will be independent with HEP in order to improve functional outcomes. Baseline:  Goal status: INITIAL  2.  Patient will report at least 25% improvement in symptoms for improved quality of life. Baseline:  Goal status: INITIAL    LONG TERM GOALS: Target date: 11/18/2022    Patient will report at  least 75% improvement in symptoms for improved quality of life. Baseline:  Goal status: INITIAL  2.  Patient will improve FOTO score by at least 5 points in order to indicate improved tolerance to activity. Baseline: 63% function Goal status: INITIAL  3.  Patient will demonstrate at least 25% improvement in lumbar ROM in all restricted planes for improved ability to move trunk while completing chores. Baseline: see above Goal status: INITIAL  4.  Patient will be able to complete 5x STS in under 11.4 seconds in order to reduce the risk of falls. Baseline: 12.14 seconds without UE use, Relies LLE >RLE Goal status: INITIAL  5.  Patient will demonstrate grade of 5/5 MMT grade in all tested musculature as evidence of improved strength to assist with stair ambulation and gait.   Baseline: see above Goal status: INITIAL     PLAN:  PT FREQUENCY: 2x/week  PT DURATION: 6 weeks  PLANNED INTERVENTIONS: Therapeutic exercises, Therapeutic activity, Neuromuscular re-education, Balance  training, Gait training, Patient/Family education, Joint manipulation, Joint mobilization, Stair training, Orthotic/Fit training, DME instructions, Aquatic Therapy, Dry Needling, Electrical stimulation, Spinal manipulation, Spinal mobilization, Cryotherapy, Moist heat, Compression bandaging, scar mobilization, Splintting, Taping, Traction, Ultrasound, Ionotophoresis 4mg /ml Dexamethasone, and Manual therapy  PLAN FOR NEXT SESSION: core and hip strength, hip mobility, functional strength   Reola Mosher Minerva Bluett, PT 10/09/2022, 9:06 AM

## 2022-10-13 ENCOUNTER — Other Ambulatory Visit: Payer: Self-pay | Admitting: Family Medicine

## 2022-10-13 DIAGNOSIS — M545 Low back pain, unspecified: Secondary | ICD-10-CM

## 2022-10-13 DIAGNOSIS — M79641 Pain in right hand: Secondary | ICD-10-CM

## 2022-10-14 NOTE — Telephone Encounter (Signed)
Typically would not recommend using meloxicam beyond a few weeks.  I will send in a few more, but would like to see her try to taper off that med.  If still having discomfort, can schedule follow-up and we can discuss next plan.  Thanks

## 2022-10-14 NOTE — Telephone Encounter (Signed)
Patient is requesting a refill of the following medications: Requested Prescriptions   Pending Prescriptions Disp Refills   meloxicam (MOBIC) 7.5 MG tablet [Pharmacy Med Name: MELOXICAM 7.5MG  TABLETS] 30 tablet 0    Sig: TAKE 1 TABLET(7.5 MG) BY MOUTH DAILY AS NEEDED FOR PAIN    Date of patient request: 10/14/22 Last office visit: 08/14/22 Date of last refill: 09/11/22 Last refill amount: 30 Follow up time period per chart: 6 weeks

## 2022-10-15 ENCOUNTER — Other Ambulatory Visit: Payer: Self-pay

## 2022-10-15 NOTE — Telephone Encounter (Signed)
Pt does not need much she is in PT

## 2022-10-17 ENCOUNTER — Ambulatory Visit (HOSPITAL_COMMUNITY): Payer: Managed Care, Other (non HMO)

## 2022-10-17 ENCOUNTER — Encounter (HOSPITAL_COMMUNITY): Payer: Self-pay

## 2022-10-17 DIAGNOSIS — M6281 Muscle weakness (generalized): Secondary | ICD-10-CM

## 2022-10-17 DIAGNOSIS — R29898 Other symptoms and signs involving the musculoskeletal system: Secondary | ICD-10-CM

## 2022-10-17 DIAGNOSIS — M5459 Other low back pain: Secondary | ICD-10-CM

## 2022-10-17 DIAGNOSIS — R2689 Other abnormalities of gait and mobility: Secondary | ICD-10-CM

## 2022-10-17 DIAGNOSIS — M25551 Pain in right hip: Secondary | ICD-10-CM | POA: Diagnosis not present

## 2022-10-17 NOTE — Therapy (Signed)
OUTPATIENT PHYSICAL THERAPY TREATMENT   Patient Name: Teresa Strong MRN: [id number] DOB:12-22-1955, 67 y.o., female Today's Date: 10/17/2022  END OF SESSION:  PT End of Session - 10/17/22 0729     Visit Number 3    Number of Visits 12    Date for PT Re-Evaluation 11/18/22    Authorization Type [employer] Managed    PT Start Time [phone]    PT Stop Time 0809    PT Time Calculation (min) 38 min    Activity Tolerance Patient tolerated treatment well    Behavior During Therapy [facility] for tasks assessed/performed             Past Medical History:  Diagnosis Date   Arthritis    Bell's palsy    GERD (gastroesophageal reflux disease)    Hyperlipidemia    Obesity    Prediabetes    Past Surgical History:  Procedure Laterality Date   ABDOMINAL HYSTERECTOMY     TOTAL ABDOMINAL HYSTERECTOMY     TOTAL KNEE ARTHROPLASTY Right    Patient Active Problem List   Diagnosis Date Noted   Bell's palsy 06/14/2013   GERD (gastroesophageal reflux disease) 09/22/2012   BMI 40.0-44.9, adult (HCC) 09/22/2012    PCP: Shade Flood, MD  REFERRING PROVIDER: Shade Flood, MD  REFERRING DIAG: M54.50 (ICD-10-CM) - Right-sided low back pain without sciatica, unspecified chronicity  Rationale for Evaluation and Treatment: Rehabilitation  THERAPY DIAG:  Pain in right hip  Other low back pain  Muscle weakness (generalized)  Other abnormalities of gait and mobility  Other symptoms and signs involving the musculoskeletal system  ONSET DATE: 6 months  SUBJECTIVE:                                                                                                                                                                                           SUBJECTIVE STATEMENT: Pt stated she is feeling good today.  Feels the home exercises are helpful.    EVAL: Patient states low back pain that began about 6 months ago with insidious onset. She doesn't have any symptoms in her leg. Symptoms were  not constant or every day. Meloxicam has helped symptoms. Symptoms were not bothering her over the weekend. Symptoms come and go. Had R knee surgery in the past and abdominal pain last year. Patient states symptoms increase with transitions in/out of low car, increase in activity all day. Symptoms decrease with meds and rest. R TKA 2 years ago. Moved into new house in Jan. 2024 with steep driveway and stairs which she was not used to doing.   PERTINENT HISTORY:  Chronic back  pain, HLD, increased BMI  PAIN:  Are you having pain? Yes: NPRS scale: 0/10 Pain location: R LBP/glute Pain description: stabbing Aggravating factors: in/out of low car, increased activity Relieving factors: med, rest  PRECAUTIONS: None  WEIGHT BEARING RESTRICTIONS: No  FALLS: Has patient fallen in last 6 months? No  OCCUPATION: Geographical information systems officer   PLOF: Independent  PATIENT GOALS: back to feeling better   OBJECTIVE:   PATIENT SURVEYS:  FOTO 63% function  SCREENING FOR RED FLAGS: Bowel or bladder incontinence: No Spinal tumors: No Cauda equina syndrome: No Compression fracture: No Abdominal aneurysm: No  COGNITION: Overall cognitive status: Within functional limits for tasks assessed     SENSATION: WFL  POSTURE: rounded shoulders, forward head, increased thoracic kyphosis, and anterior pelvic tilt  PALPATION: Hypomobile thoracic spine, tender lumbar CPA; TTP R glute max proximally, bilateral glute med/min and piriformis  LUMBAR ROM:   AROM eval  Flexion 25% limited  Extension 50% limited  Right lateral flexion 25% limited *  Left lateral flexion 25% limited *  Right rotation 0% limited  Left rotation 0% limited   (Blank rows = not tested)  LOWER EXTREMITY ROM:   decreased R hip extension PROM and AROM  Active  Right eval Left eval  Hip flexion    Hip extension    Hip abduction    Hip adduction    Hip internal rotation    Hip external rotation    Knee flexion    Knee extension     Ankle dorsiflexion    Ankle plantarflexion    Ankle inversion    Ankle eversion     (Blank rows = not tested)  LOWER EXTREMITY MMT:    MMT Right eval Left eval  Hip flexion 4+* 5  Hip extension 4- 4-  Hip abduction 4- 4-  Hip adduction    Hip internal rotation    Hip external rotation    Knee flexion 5 5  Knee extension 5 5  Ankle dorsiflexion 5 5  Ankle plantarflexion    Ankle inversion    Ankle eversion     (Blank rows = not tested)    FUNCTIONAL TESTS:  5 times sit to stand: 12.14 seconds without UE use, Relies LLE >RLE   GAIT: Distance walked: 100 feet Assistive device utilized: None Level of assistance: Complete Independence Comments: WFL  TODAY'S TREATMENT:                                                                                                                              DATE:  10/16/52: 3D hip excursion (weight shift, rotate, STS, lumbar extension) Supine: Bridge Educated ab sets paired with exhale x 3 min Ab set x 1 min March with ab set 10x- increased Rt hip pain LTR 5x 10" SKTC 2x 30" DKTC with green ball under heels 1 x 10 with 5 second holds  Sidelying:  Clam 10x 3"  10/09/22 Standing lumbar extension 1 x  10  Bridge 2x 10  DKTC with green ball under heels 1 x 10 with 5 second holds Supine piriformis stretch 3 x 20 second holds bilateral Supine ab set 1 x 10 x 5 second holds Supine ab set with march 2 x 10 bilateral SLR 1 x 10 bilateral  Standing lumbar extension 1 x 10   10/07/22 Standing lumbar extension 1 x 10  Bridge 1x 10    PATIENT EDUCATION:  Education details:10/09/22:HEP, posture, lumbar roll;  Eval: Patient educated on exam findings, POC, scope of PT, HEP. Person educated: Patient Education method: Explanation, Demonstration, and Handouts Education comprehension: verbalized understanding, returned demonstration, verbal cues required, and tactile cues required  HOME EXERCISE PROGRAM: Access Code: [email or web address] URL:  [email or web address]  Date: 10/07/2022 - Standing Lumbar Extension  - 5 x daily - 7 x weekly - 1 sets - 10 reps - Supine Bridge  - 2 x daily - 7 x weekly - 1-2 sets - 10 reps  10/09/22 - Supine Piriformis Stretch with Towel  - 1 x daily - 7 x weekly - 2 sets - 10 reps - Abdominal Bracing  - 1 x daily - 7 x weekly - 1 sets - 10 reps - 5 second hold - Supine March with Posterior Pelvic Tilt  - 1 x daily - 7 x weekly - 2 sets - 10 reps  ASSESSMENT:  CLINICAL IMPRESSION: Session focus with lumbar mobility and core/proximal strengthening.  Pt educated techniques to improve abdominal sets paired with exhalation to improve TrA activation and reduce holding breath.  EOS pt reports some discomfort following supine exercises which was reduced following lumbar extension and walking.  OBJECTIVE IMPAIRMENTS: Abnormal gait, decreased activity tolerance, decreased balance, decreased endurance, decreased mobility, difficulty walking, decreased ROM, decreased strength, increased muscle spasms, impaired flexibility, improper body mechanics, postural dysfunction, and pain.   ACTIVITY LIMITATIONS: carrying, lifting, bending, standing, squatting, stairs, transfers, hygiene/grooming, locomotion level, and caring for others  PARTICIPATION LIMITATIONS: meal prep, cleaning, laundry, shopping, community activity, and yard work  PERSONAL FACTORS: Time since onset of injury/illness/exacerbation and 3+ comorbidities: Chronic back pain, HLD, increased BMI  are also affecting patient's functional outcome.   REHAB POTENTIAL: Good  CLINICAL DECISION MAKING: Stable/uncomplicated  EVALUATION COMPLEXITY: Low   GOALS: Goals reviewed with patient? Yes  SHORT TERM GOALS: Target date: 10/28/2022    Patient will be independent with HEP in order to improve functional outcomes. Baseline:  Goal status: IN PROGRESS  2.  Patient will report at least 25% improvement in symptoms for improved quality of  life. Baseline:  Goal status: IN PROGRESS    LONG TERM GOALS: Target date: 11/18/2022    Patient will report at least 75% improvement in symptoms for improved quality of life. Baseline:  Goal status: IN PROGRESS  2.  Patient will improve FOTO score by at least 5 points in order to indicate improved tolerance to activity. Baseline: 63% function Goal status: IN PROGRESS  3.  Patient will demonstrate at least 25% improvement in lumbar ROM in all restricted planes for improved ability to move trunk while completing chores. Baseline: see above Goal status: IN PROGRESS  4.  Patient will be able to complete 5x STS in under 11.4 seconds in order to reduce the risk of falls. Baseline: 12.14 seconds without UE use, Relies LLE >RLE Goal status: IN PROGRESS  5.  Patient will demonstrate grade of 5/5 MMT grade in all tested musculature as evidence of improved strength to assist with stair ambulation and gait.  Baseline: see above Goal status: IN PROGRESS     PLAN:  PT FREQUENCY: 2x/week  PT DURATION: 6 weeks  PLANNED INTERVENTIONS: Therapeutic exercises, Therapeutic activity, Neuromuscular re-education, Balance training, Gait training, Patient/Family education, Joint manipulation, Joint mobilization, Stair training, Orthotic/Fit training, DME instructions, Aquatic Therapy, Dry Needling, Electrical stimulation, Spinal manipulation, Spinal mobilization, Cryotherapy, Moist heat, Compression bandaging, scar mobilization, Splintting, Taping, Traction, Ultrasound, Ionotophoresis 4mg /ml Dexamethasone, and Manual therapy  PLAN FOR NEXT SESSION: core and hip strength, hip mobility, functional strength  Becky Sax, LPTA/CLT; CBIS [phone]  Juel Burrow, PTA 10/17/2022, 8:16 AM

## 2022-10-22 ENCOUNTER — Encounter (HOSPITAL_COMMUNITY): Payer: Self-pay

## 2022-10-22 ENCOUNTER — Ambulatory Visit (HOSPITAL_COMMUNITY): Payer: Managed Care, Other (non HMO)

## 2022-10-22 DIAGNOSIS — M5459 Other low back pain: Secondary | ICD-10-CM

## 2022-10-22 DIAGNOSIS — M6281 Muscle weakness (generalized): Secondary | ICD-10-CM

## 2022-10-22 DIAGNOSIS — R29898 Other symptoms and signs involving the musculoskeletal system: Secondary | ICD-10-CM

## 2022-10-22 DIAGNOSIS — M25551 Pain in right hip: Secondary | ICD-10-CM

## 2022-10-22 DIAGNOSIS — R2689 Other abnormalities of gait and mobility: Secondary | ICD-10-CM

## 2022-10-22 NOTE — Therapy (Signed)
OUTPATIENT PHYSICAL THERAPY TREATMENT   Patient Name: Teresa Strong MRN: [id number] DOB:November 10, 1955, 67 y.o., female Today's Date: 10/22/2022  END OF SESSION:  PT End of Session - 10/22/22 0740     Visit Number 4    Number of Visits 12    Date for PT Re-Evaluation 11/18/22    Authorization Type [employer] Managed    PT Start Time 0732    PT Stop Time 0811    PT Time Calculation (min) 39 min    Activity Tolerance Patient tolerated treatment well    Behavior During Therapy [facility] for tasks assessed/performed              Past Medical History:  Diagnosis Date   Arthritis    Bell's palsy    GERD (gastroesophageal reflux disease)    Hyperlipidemia    Obesity    Prediabetes    Past Surgical History:  Procedure Laterality Date   ABDOMINAL HYSTERECTOMY     TOTAL ABDOMINAL HYSTERECTOMY     TOTAL KNEE ARTHROPLASTY Right    Patient Active Problem List   Diagnosis Date Noted   Bell's palsy 06/14/2013   GERD (gastroesophageal reflux disease) 09/22/2012   BMI 40.0-44.9, adult (HCC) 09/22/2012    PCP: Shade Flood, MD  REFERRING PROVIDER: Shade Flood, MD  REFERRING DIAG: M54.50 (ICD-10-CM) - Right-sided low back pain without sciatica, unspecified chronicity  Rationale for Evaluation and Treatment: Rehabilitation  THERAPY DIAG:  Pain in right hip  Other low back pain  Muscle weakness (generalized)  Other abnormalities of gait and mobility  Other symptoms and signs involving the musculoskeletal system  ONSET DATE: 6 months  SUBJECTIVE:                                                                                                                                                                                           SUBJECTIVE STATEMENT: Reports she is feeling good today, most difficulty with arthritis in her hands on Friday.  Has some discomfort getting in and out of car.  EVAL: Patient states low back pain that began about 6 months ago with insidious  onset. She doesn't have any symptoms in her leg. Symptoms were not constant or every day. Meloxicam has helped symptoms. Symptoms were not bothering her over the weekend. Symptoms come and go. Had R knee surgery in the past and abdominal pain last year. Patient states symptoms increase with transitions in/out of low car, increase in activity all day. Symptoms decrease with meds and rest. R TKA 2 years ago. Moved into new house in Jan. 2024 with steep driveway and stairs which she was not  used to doing.   PERTINENT HISTORY:  Chronic back pain, HLD, increased BMI  PAIN:  Are you having pain? Yes: NPRS scale: 0/10 Pain location: R LBP/glute Pain description: stabbing Aggravating factors: in/out of low car, increased activity Relieving factors: med, rest  PRECAUTIONS: None  WEIGHT BEARING RESTRICTIONS: No  FALLS: Has patient fallen in last 6 months? No  OCCUPATION: Geographical information systems officer   PLOF: Independent  PATIENT GOALS: back to feeling better   OBJECTIVE:   PATIENT SURVEYS:  FOTO 63% function  SCREENING FOR RED FLAGS: Bowel or bladder incontinence: No Spinal tumors: No Cauda equina syndrome: No Compression fracture: No Abdominal aneurysm: No  COGNITION: Overall cognitive status: Within functional limits for tasks assessed     SENSATION: WFL  POSTURE: rounded shoulders, forward head, increased thoracic kyphosis, and anterior pelvic tilt  PALPATION: Hypomobile thoracic spine, tender lumbar CPA; TTP R glute max proximally, bilateral glute med/min and piriformis  LUMBAR ROM:   AROM eval  Flexion 25% limited  Extension 50% limited  Right lateral flexion 25% limited *  Left lateral flexion 25% limited *  Right rotation 0% limited  Left rotation 0% limited   (Blank rows = not tested)  LOWER EXTREMITY ROM:   decreased R hip extension PROM and AROM  Active  Right eval Left eval  Hip flexion    Hip extension    Hip abduction    Hip adduction    Hip internal rotation     Hip external rotation    Knee flexion    Knee extension    Ankle dorsiflexion    Ankle plantarflexion    Ankle inversion    Ankle eversion     (Blank rows = not tested)  LOWER EXTREMITY MMT:    MMT Right eval Left eval  Hip flexion 4+* 5  Hip extension 4- 4-  Hip abduction 4- 4-  Hip adduction    Hip internal rotation    Hip external rotation    Knee flexion 5 5  Knee extension 5 5  Ankle dorsiflexion 5 5  Ankle plantarflexion    Ankle inversion    Ankle eversion     (Blank rows = not tested)    FUNCTIONAL TESTS:  5 times sit to stand: 12.14 seconds without UE use, Relies LLE >RLE   GAIT: Distance walked: 100 feet Assistive device utilized: None Level of assistance: Complete Independence Comments: WFL  TODAY'S TREATMENT:                                                                                                                              DATE:  10/22/22: STS Standing: Lumbar extension Heel and toe raises 10x Marching alternating with ab set 10x  POE-press up discomfort so DC'd exercise  Supine: LTR 5x 10" DKTC with green ball under heels 1 x 10 with 5 second holds Isometric abdominal sets with theraball 10x5" Isometric oblique with theraball 10x 5"  Sidelying: abduction 10x  10/16/52: 3D hip excursion (weight shift, rotate, STS, lumbar extension) Supine: Bridge Educated ab sets paired with exhale x 3 min Ab set x 1 min March with ab set 10x- increased Rt hip pain LTR 5x 10" SKTC 2x 30" DKTC with green ball under heels 1 x 10 with 5 second holds  Sidelying:  Clam 10x 3"  10/09/22 Standing lumbar extension 1 x 10  Bridge 2x 10  DKTC with green ball under heels 1 x 10 with 5 second holds Supine piriformis stretch 3 x 20 second holds bilateral Supine ab set 1 x 10 x 5 second holds Supine ab set with march 2 x 10 bilateral SLR 1 x 10 bilateral  Standing lumbar extension 1 x 10   10/07/22 Standing lumbar extension 1 x 10  Bridge 1x 10     PATIENT EDUCATION:  Education details:10/09/22:HEP, posture, lumbar roll;  Eval: Patient educated on exam findings, POC, scope of PT, HEP. Person educated: Patient Education method: Explanation, Demonstration, and Handouts Education comprehension: verbalized understanding, returned demonstration, verbal cues required, and tactile cues required  HOME EXERCISE PROGRAM: Access Code: [email or web address] URL: [email or web address]  Date: 10/07/2022 - Standing Lumbar Extension  - 5 x daily - 7 x weekly - 1 sets - 10 reps - Supine Bridge  - 2 x daily - 7 x weekly - 1-2 sets - 10 reps  10/09/22 - Supine Piriformis Stretch with Towel  - 1 x daily - 7 x weekly - 2 sets - 10 reps - Abdominal Bracing  - 1 x daily - 7 x weekly - 1 sets - 10 reps - 5 second hold - Supine March with Posterior Pelvic Tilt  - 1 x daily - 7 x weekly - 2 sets - 10 reps  ASSESSMENT:  CLINICAL IMPRESSION: Session focus with lumbar mobility and core/proximal strengthening.  Added sidelying abduction, isometric theraball, and standing exercises for core stability with good tolerance.  Pt did c/o discomfort in prone position so DC'd exercise from POC.  No reports of pain at EOS.     OBJECTIVE IMPAIRMENTS: Abnormal gait, decreased activity tolerance, decreased balance, decreased endurance, decreased mobility, difficulty walking, decreased ROM, decreased strength, increased muscle spasms, impaired flexibility, improper body mechanics, postural dysfunction, and pain.   ACTIVITY LIMITATIONS: carrying, lifting, bending, standing, squatting, stairs, transfers, hygiene/grooming, locomotion level, and caring for others  PARTICIPATION LIMITATIONS: meal prep, cleaning, laundry, shopping, community activity, and yard work  PERSONAL FACTORS: Time since onset of injury/illness/exacerbation and 3+ comorbidities: Chronic back pain, HLD, increased BMI  are also affecting patient's functional outcome.   REHAB POTENTIAL: Good  CLINICAL  DECISION MAKING: Stable/uncomplicated  EVALUATION COMPLEXITY: Low   GOALS: Goals reviewed with patient? Yes  SHORT TERM GOALS: Target date: 10/28/2022    Patient will be independent with HEP in order to improve functional outcomes. Baseline:  Goal status: IN PROGRESS  2.  Patient will report at least 25% improvement in symptoms for improved quality of life. Baseline:  Goal status: IN PROGRESS    LONG TERM GOALS: Target date: 11/18/2022    Patient will report at least 75% improvement in symptoms for improved quality of life. Baseline:  Goal status: IN PROGRESS  2.  Patient will improve FOTO score by at least 5 points in order to indicate improved tolerance to activity. Baseline: 63% function Goal status: IN PROGRESS  3.  Patient will demonstrate at least 25% improvement in lumbar ROM in all restricted planes for improved ability to move trunk while completing chores. Baseline:  see above Goal status: IN PROGRESS  4.  Patient will be able to complete 5x STS in under 11.4 seconds in order to reduce the risk of falls. Baseline: 12.14 seconds without UE use, Relies LLE >RLE Goal status: IN PROGRESS  5.  Patient will demonstrate grade of 5/5 MMT grade in all tested musculature as evidence of improved strength to assist with stair ambulation and gait.   Baseline: see above Goal status: IN PROGRESS     PLAN:  PT FREQUENCY: 2x/week  PT DURATION: 6 weeks  PLANNED INTERVENTIONS: Therapeutic exercises, Therapeutic activity, Neuromuscular re-education, Balance training, Gait training, Patient/Family education, Joint manipulation, Joint mobilization, Stair training, Orthotic/Fit training, DME instructions, Aquatic Therapy, Dry Needling, Electrical stimulation, Spinal manipulation, Spinal mobilization, Cryotherapy, Moist heat, Compression bandaging, scar mobilization, Splintting, Taping, Traction, Ultrasound, Ionotophoresis 4mg /ml Dexamethasone, and Manual therapy  PLAN FOR NEXT  SESSION: core and hip strength, hip mobility, functional strength.  Begin squats next session.  Becky Sax, LPTA/CLT; CBIS [phone]  Juel Burrow, PTA 10/22/2022, 9:43 AM

## 2022-10-24 ENCOUNTER — Encounter (HOSPITAL_COMMUNITY): Payer: Self-pay

## 2022-10-24 ENCOUNTER — Ambulatory Visit (HOSPITAL_COMMUNITY): Payer: Managed Care, Other (non HMO)

## 2022-10-24 DIAGNOSIS — R2689 Other abnormalities of gait and mobility: Secondary | ICD-10-CM

## 2022-10-24 DIAGNOSIS — M25551 Pain in right hip: Secondary | ICD-10-CM | POA: Diagnosis not present

## 2022-10-24 DIAGNOSIS — R29898 Other symptoms and signs involving the musculoskeletal system: Secondary | ICD-10-CM

## 2022-10-24 DIAGNOSIS — M6281 Muscle weakness (generalized): Secondary | ICD-10-CM

## 2022-10-24 DIAGNOSIS — M5459 Other low back pain: Secondary | ICD-10-CM

## 2022-10-24 NOTE — Therapy (Signed)
OUTPATIENT PHYSICAL THERAPY TREATMENT   Patient Name: Teresa Strong MRN: [id number] DOB:1955/09/23, 67 y.o., female Today's Date: 10/24/2022  END OF SESSION:  PT End of Session - 10/24/22 0729     Visit Number 5    Number of Visits 12    Date for PT Re-Evaluation 11/18/22    Authorization Type [employer] Managed    PT Start Time [phone]    PT Stop Time 0811    PT Time Calculation (min) 40 min    Activity Tolerance Patient tolerated treatment well    Behavior During Therapy [facility] for tasks assessed/performed              Past Medical History:  Diagnosis Date   Arthritis    Bell's palsy    GERD (gastroesophageal reflux disease)    Hyperlipidemia    Obesity    Prediabetes    Past Surgical History:  Procedure Laterality Date   ABDOMINAL HYSTERECTOMY     TOTAL ABDOMINAL HYSTERECTOMY     TOTAL KNEE ARTHROPLASTY Right    Patient Active Problem List   Diagnosis Date Noted   Bell's palsy 06/14/2013   GERD (gastroesophageal reflux disease) 09/22/2012   BMI 40.0-44.9, adult (HCC) 09/22/2012    PCP: Shade Flood, MD  REFERRING PROVIDER: Shade Flood, MD  REFERRING DIAG: M54.50 (ICD-10-CM) - Right-sided low back pain without sciatica, unspecified chronicity  Rationale for Evaluation and Treatment: Rehabilitation  THERAPY DIAG:  Pain in right hip  Other low back pain  Muscle weakness (generalized)  Other symptoms and signs involving the musculoskeletal system  Other abnormalities of gait and mobility  ONSET DATE: 6 months  SUBJECTIVE:                                                                                                                                                                                           SUBJECTIVE STATEMENT: Pt stated she felt good following last session.  Continues to have some discomfort getting in and out of car though reports pain does not last long anymore.    EVAL: Patient states low back pain that began about 6  months ago with insidious onset. She doesn't have any symptoms in her leg. Symptoms were not constant or every day. Meloxicam has helped symptoms. Symptoms were not bothering her over the weekend. Symptoms come and go. Had R knee surgery in the past and abdominal pain last year. Patient states symptoms increase with transitions in/out of low car, increase in activity all day. Symptoms decrease with meds and rest. R TKA 2 years ago. Moved into new house in Jan. 2024 with steep driveway and  stairs which she was not used to doing.   PERTINENT HISTORY:  Chronic back pain, HLD, increased BMI  PAIN:  Are you having pain? Yes: NPRS scale: 0/10 Pain location: R LBP/glute Pain description: stabbing Aggravating factors: in/out of low car, increased activity Relieving factors: med, rest  PRECAUTIONS: None  WEIGHT BEARING RESTRICTIONS: No  FALLS: Has patient fallen in last 6 months? No  OCCUPATION: Geographical information systems officer   PLOF: Independent  PATIENT GOALS: back to feeling better   OBJECTIVE:   PATIENT SURVEYS:  FOTO 63% function  SCREENING FOR RED FLAGS: Bowel or bladder incontinence: No Spinal tumors: No Cauda equina syndrome: No Compression fracture: No Abdominal aneurysm: No  COGNITION: Overall cognitive status: Within functional limits for tasks assessed     SENSATION: WFL  POSTURE: rounded shoulders, forward head, increased thoracic kyphosis, and anterior pelvic tilt  PALPATION: Hypomobile thoracic spine, tender lumbar CPA; TTP R glute max proximally, bilateral glute med/min and piriformis  LUMBAR ROM:   AROM eval  Flexion 25% limited  Extension 50% limited  Right lateral flexion 25% limited *  Left lateral flexion 25% limited *  Right rotation 0% limited  Left rotation 0% limited   (Blank rows = not tested)  LOWER EXTREMITY ROM:   decreased R hip extension PROM and AROM  Active  Right eval Left eval  Hip flexion    Hip extension    Hip abduction    Hip adduction     Hip internal rotation    Hip external rotation    Knee flexion    Knee extension    Ankle dorsiflexion    Ankle plantarflexion    Ankle inversion    Ankle eversion     (Blank rows = not tested)  LOWER EXTREMITY MMT:    MMT Right eval Left eval  Hip flexion 4+* 5  Hip extension 4- 4-  Hip abduction 4- 4-  Hip adduction    Hip internal rotation    Hip external rotation    Knee flexion 5 5  Knee extension 5 5  Ankle dorsiflexion 5 5  Ankle plantarflexion    Ankle inversion    Ankle eversion     (Blank rows = not tested)    FUNCTIONAL TESTS:  5 times sit to stand: 12.14 seconds without UE use, Relies LLE >RLE   GAIT: Distance walked: 100 feet Assistive device utilized: None Level of assistance: Complete Independence Comments: WFL  TODAY'S TREATMENT:                                                                                                                              DATE:  10/24/22 Standing: 3D hip excursion (Squat front of mat, Lumbar extension, Rotation- twing of Rt LBP with rotation to the right,Weight shifting with UE overhead)  Marching alternating with ab set 10x 5"  Heel and toe raise 10x  RTB- diagonal chop 2x 10   Shoulder extension   Rows  Paloff with ab set  Hip abduction 2x 10  Sidestep 2RT with GTB around thigh  10/22/22: STS Standing: Lumbar extension Heel and toe raises 10x Marching alternating with ab set 10x  POE-press up discomfort so DC'd exercise  Supine: LTR 5x 10" DKTC with green ball under heels 1 x 10 with 5 second holds Isometric abdominal sets with theraball 10x5" Isometric oblique with theraball 10x 5"  Sidelying: abduction 10x  10/16/52: 3D hip excursion (weight shift, rotate, STS, lumbar extension) Supine: Bridge Educated ab sets paired with exhale x 3 min Ab set x 1 min March with ab set 10x- increased Rt hip pain LTR 5x 10" SKTC 2x 30" DKTC with green ball under heels 1 x 10 with 5 second  holds  Sidelying:  Clam 10x 3"  10/09/22 Standing lumbar extension 1 x 10  Bridge 2x 10  DKTC with green ball under heels 1 x 10 with 5 second holds Supine piriformis stretch 3 x 20 second holds bilateral Supine ab set 1 x 10 x 5 second holds Supine ab set with march 2 x 10 bilateral SLR 1 x 10 bilateral  Standing lumbar extension 1 x 10   10/07/22 Standing lumbar extension 1 x 10  Bridge 1x 10    PATIENT EDUCATION:  Education details:10/09/22:HEP, posture, lumbar roll;  Eval: Patient educated on exam findings, POC, scope of PT, HEP. Person educated: Patient Education method: Explanation, Demonstration, and Handouts Education comprehension: verbalized understanding, returned demonstration, verbal cues required, and tactile cues required  HOME EXERCISE PROGRAM: Access Code: [email or web address] URL: [email or web address]  Date: 10/07/2022 - Standing Lumbar Extension  - 5 x daily - 7 x weekly - 1 sets - 10 reps - Supine Bridge  - 2 x daily - 7 x weekly - 1-2 sets - 10 reps  10/09/22 - Supine Piriformis Stretch with Towel  - 1 x daily - 7 x weekly - 2 sets - 10 reps - Abdominal Bracing  - 1 x daily - 7 x weekly - 1 sets - 10 reps - 5 second hold - Supine March with Posterior Pelvic Tilt  - 1 x daily - 7 x weekly - 2 sets - 10 reps  10/24/22: - Sit to Stand  - 1 x daily - 7 x weekly - 3 sets - 10 reps - Squat with Chair Touch  - 1 x daily - 7 x weekly - 3 sets - 10 reps - Standing Anti-Rotation Press with Anchored Resistance  - 1 x daily - 7 x weekly - 2 sets - 10 reps - 5" hold - Standing Diagonal Chop  - 1 x daily - 7 x weekly - 2 sets - 10 reps - 5" hold - Shoulder Extension with Resistance - Palms Forward  - 1 x daily - 7 x weekly - 2 sets - 10 reps - 5" hold - Standing Bilateral Low Shoulder Row with Anchored Resistance  - 1 x daily - 7 x weekly - 2 sets - 10 reps - 5" hold ASSESSMENT:  CLINICAL IMPRESSION: Progressed to standing exercises for lumbar stability and mobility.   Pt tolerated well to all new exercises with good form following initial cueing for abdominal stability.  Pt reoprts occasional twing Rt lower back with some rotation based movements that is resolved shortly.  Added STS/ squats and theraband exercises to HEP with printout given and verbalized understanding.    OBJECTIVE IMPAIRMENTS: Abnormal gait, decreased activity tolerance, decreased balance, decreased endurance, decreased mobility, difficulty walking, decreased ROM,  decreased strength, increased muscle spasms, impaired flexibility, improper body mechanics, postural dysfunction, and pain.   ACTIVITY LIMITATIONS: carrying, lifting, bending, standing, squatting, stairs, transfers, hygiene/grooming, locomotion level, and caring for others  PARTICIPATION LIMITATIONS: meal prep, cleaning, laundry, shopping, community activity, and yard work  PERSONAL FACTORS: Time since onset of injury/illness/exacerbation and 3+ comorbidities: Chronic back pain, HLD, increased BMI  are also affecting patient's functional outcome.   REHAB POTENTIAL: Good  CLINICAL DECISION MAKING: Stable/uncomplicated  EVALUATION COMPLEXITY: Low   GOALS: Goals reviewed with patient? Yes  SHORT TERM GOALS: Target date: 10/28/2022    Patient will be independent with HEP in order to improve functional outcomes. Baseline:  Goal status: IN PROGRESS  2.  Patient will report at least 25% improvement in symptoms for improved quality of life. Baseline:  Goal status: IN PROGRESS    LONG TERM GOALS: Target date: 11/18/2022    Patient will report at least 75% improvement in symptoms for improved quality of life. Baseline:  Goal status: IN PROGRESS  2.  Patient will improve FOTO score by at least 5 points in order to indicate improved tolerance to activity. Baseline: 63% function Goal status: IN PROGRESS  3.  Patient will demonstrate at least 25% improvement in lumbar ROM in all restricted planes for improved ability to  move trunk while completing chores. Baseline: see above Goal status: IN PROGRESS  4.  Patient will be able to complete 5x STS in under 11.4 seconds in order to reduce the risk of falls. Baseline: 12.14 seconds without UE use, Relies LLE >RLE Goal status: IN PROGRESS  5.  Patient will demonstrate grade of 5/5 MMT grade in all tested musculature as evidence of improved strength to assist with stair ambulation and gait.   Baseline: see above Goal status: IN PROGRESS     PLAN:  PT FREQUENCY: 2x/week  PT DURATION: 6 weeks  PLANNED INTERVENTIONS: Therapeutic exercises, Therapeutic activity, Neuromuscular re-education, Balance training, Gait training, Patient/Family education, Joint manipulation, Joint mobilization, Stair training, Orthotic/Fit training, DME instructions, Aquatic Therapy, Dry Needling, Electrical stimulation, Spinal manipulation, Spinal mobilization, Cryotherapy, Moist heat, Compression bandaging, scar mobilization, Splintting, Taping, Traction, Ultrasound, Ionotophoresis 4mg /ml Dexamethasone, and Manual therapy  PLAN FOR NEXT SESSION: core and hip strength, hip mobility, functional strength.  Add QL stretches and vector stance when ready.    Becky Sax, LPTA/CLT; CBIS [phone]  Juel Burrow, PTA 10/24/2022, 8:34 AM

## 2022-10-28 ENCOUNTER — Encounter (HOSPITAL_COMMUNITY): Payer: Self-pay | Admitting: Physical Therapy

## 2022-10-28 ENCOUNTER — Ambulatory Visit (HOSPITAL_COMMUNITY): Payer: Managed Care, Other (non HMO) | Admitting: Physical Therapy

## 2022-10-28 DIAGNOSIS — M6281 Muscle weakness (generalized): Secondary | ICD-10-CM

## 2022-10-28 DIAGNOSIS — M25551 Pain in right hip: Secondary | ICD-10-CM

## 2022-10-28 DIAGNOSIS — R29898 Other symptoms and signs involving the musculoskeletal system: Secondary | ICD-10-CM

## 2022-10-28 DIAGNOSIS — R2689 Other abnormalities of gait and mobility: Secondary | ICD-10-CM

## 2022-10-28 DIAGNOSIS — M5459 Other low back pain: Secondary | ICD-10-CM

## 2022-10-28 NOTE — Therapy (Signed)
OUTPATIENT PHYSICAL THERAPY TREATMENT   Patient Name: Teresa Strong MRN: [id number] DOB:31-Jul-1955, 67 y.o., female Today's Date: 10/28/2022  END OF SESSION:  PT End of Session - 10/28/22 0820     Visit Number 6    Number of Visits 12    Date for PT Re-Evaluation 11/18/22    Authorization Type [employer] Managed    PT Start Time 0820    PT Stop Time 0900    PT Time Calculation (min) 40 min    Activity Tolerance Patient tolerated treatment well    Behavior During Therapy [facility] for tasks assessed/performed              Past Medical History:  Diagnosis Date   Arthritis    Bell's palsy    GERD (gastroesophageal reflux disease)    Hyperlipidemia    Obesity    Prediabetes    Past Surgical History:  Procedure Laterality Date   ABDOMINAL HYSTERECTOMY     TOTAL ABDOMINAL HYSTERECTOMY     TOTAL KNEE ARTHROPLASTY Right    Patient Active Problem List   Diagnosis Date Noted   Bell's palsy 06/14/2013   GERD (gastroesophageal reflux disease) 09/22/2012   BMI 40.0-44.9, adult (HCC) 09/22/2012    PCP: Shade Flood, MD  REFERRING PROVIDER: Shade Flood, MD  REFERRING DIAG: M54.50 (ICD-10-CM) - Right-sided low back pain without sciatica, unspecified chronicity  Rationale for Evaluation and Treatment: Rehabilitation  THERAPY DIAG:  Pain in right hip  Other low back pain  Muscle weakness (generalized)  Other symptoms and signs involving the musculoskeletal system  Other abnormalities of gait and mobility  ONSET DATE: 6 months  SUBJECTIVE:                                                                                                                                                                                           SUBJECTIVE STATEMENT: Pt stated she has been feeling much better. Still pain with getting out of the car and once she stretches and walks she feels better.   EVAL: Patient states low back pain that began about 6 months ago with insidious  onset. She doesn't have any symptoms in her leg. Symptoms were not constant or every day. Meloxicam has helped symptoms. Symptoms were not bothering her over the weekend. Symptoms come and go. Had R knee surgery in the past and abdominal pain last year. Patient states symptoms increase with transitions in/out of low car, increase in activity all day. Symptoms decrease with meds and rest. R TKA 2 years ago. Moved into new house in Jan. 2024 with steep driveway and stairs which she was  not used to doing.   PERTINENT HISTORY:  Chronic back pain, HLD, increased BMI  PAIN:  Are you having pain? Yes: NPRS scale: 0/10 Pain location: R LBP/glute Pain description: stabbing Aggravating factors: in/out of low car, increased activity Relieving factors: med, rest  PRECAUTIONS: None  WEIGHT BEARING RESTRICTIONS: No  FALLS: Has patient fallen in last 6 months? No  OCCUPATION: Geographical information systems officer   PLOF: Independent  PATIENT GOALS: back to feeling better   OBJECTIVE:   PATIENT SURVEYS:  FOTO 63% function  SCREENING FOR RED FLAGS: Bowel or bladder incontinence: No Spinal tumors: No Cauda equina syndrome: No Compression fracture: No Abdominal aneurysm: No  COGNITION: Overall cognitive status: Within functional limits for tasks assessed     SENSATION: WFL  POSTURE: rounded shoulders, forward head, increased thoracic kyphosis, and anterior pelvic tilt  PALPATION: Hypomobile thoracic spine, tender lumbar CPA; TTP R glute max proximally, bilateral glute med/min and piriformis  LUMBAR ROM:   AROM eval  Flexion 25% limited  Extension 50% limited  Right lateral flexion 25% limited *  Left lateral flexion 25% limited *  Right rotation 0% limited  Left rotation 0% limited   (Blank rows = not tested)  LOWER EXTREMITY ROM:   decreased R hip extension PROM and AROM  Active  Right eval Left eval  Hip flexion    Hip extension    Hip abduction    Hip adduction    Hip internal rotation     Hip external rotation    Knee flexion    Knee extension    Ankle dorsiflexion    Ankle plantarflexion    Ankle inversion    Ankle eversion     (Blank rows = not tested)  LOWER EXTREMITY MMT:    MMT Right eval Left eval  Hip flexion 4+* 5  Hip extension 4- 4-  Hip abduction 4- 4-  Hip adduction    Hip internal rotation    Hip external rotation    Knee flexion 5 5  Knee extension 5 5  Ankle dorsiflexion 5 5  Ankle plantarflexion    Ankle inversion    Ankle eversion     (Blank rows = not tested)    FUNCTIONAL TESTS:  5 times sit to stand: 12.14 seconds without UE use, Relies LLE >RLE   GAIT: Distance walked: 100 feet Assistive device utilized: None Level of assistance: Complete Independence Comments: WFL  TODAY'S TREATMENT:                                                                                                                              DATE:  10/28/22 Standing lumbar extension 1 x 10 Marching alternating with ab set 10x 5" Standing hip abduction GTB at knees 2 x 10  Standing hip extension GTB at knees 2 x 10  Standing row GTB 2 x 10  Standing shoulder extension GTB 2 x 10 Palof press GTB 2 x 10 bilateral Standing  chop GTB 1 x 10 bilateral Standing lift GTB 1 x 10 bilateral Step up 6 inch 2 x 10 Lateral step up 6 inch 1 x 10   10/24/22 Standing: 3D hip excursion (Squat front of mat, Lumbar extension, Rotation- twing of Rt LBP with rotation to the right,Weight shifting with UE overhead)  Marching alternating with ab set 10x 5"  Heel and toe raise 10x  RTB- diagonal chop 2x 10   Shoulder extension   Rows   Paloff with ab set  Hip abduction 2x 10  Sidestep 2RT with GTB around thigh  10/22/22: STS Standing: Lumbar extension Heel and toe raises 10x Marching alternating with ab set 10x  POE-press up discomfort so DC'd exercise  Supine: LTR 5x 10" DKTC with green ball under heels 1 x 10 with 5 second holds Isometric abdominal sets with  theraball 10x5" Isometric oblique with theraball 10x 5"  Sidelying: abduction 10x  10/16/52: 3D hip excursion (weight shift, rotate, STS, lumbar extension) Supine: Bridge Educated ab sets paired with exhale x 3 min Ab set x 1 min March with ab set 10x- increased Rt hip pain LTR 5x 10" SKTC 2x 30" DKTC with green ball under heels 1 x 10 with 5 second holds  Sidelying:  Clam 10x 3"  10/09/22 Standing lumbar extension 1 x 10  Bridge 2x 10  DKTC with green ball under heels 1 x 10 with 5 second holds Supine piriformis stretch 3 x 20 second holds bilateral Supine ab set 1 x 10 x 5 second holds Supine ab set with march 2 x 10 bilateral SLR 1 x 10 bilateral  Standing lumbar extension 1 x 10   10/07/22 Standing lumbar extension 1 x 10  Bridge 1x 10    PATIENT EDUCATION:  Education details:10/09/22:HEP, posture, lumbar roll;  Eval: Patient educated on exam findings, POC, scope of PT, HEP. Person educated: Patient Education method: Explanation, Demonstration, and Handouts Education comprehension: verbalized understanding, returned demonstration, verbal cues required, and tactile cues required  HOME EXERCISE PROGRAM: Access Code: [email or web address] URL: [email or web address]  Date: 10/07/2022 - Standing Lumbar Extension  - 5 x daily - 7 x weekly - 1 sets - 10 reps - Supine Bridge  - 2 x daily - 7 x weekly - 1-2 sets - 10 reps  10/09/22 - Supine Piriformis Stretch with Towel  - 1 x daily - 7 x weekly - 2 sets - 10 reps - Abdominal Bracing  - 1 x daily - 7 x weekly - 1 sets - 10 reps - 5 second hold - Supine March with Posterior Pelvic Tilt  - 1 x daily - 7 x weekly - 2 sets - 10 reps  10/24/22: - Sit to Stand  - 1 x daily - 7 x weekly - 3 sets - 10 reps - Squat with Chair Touch  - 1 x daily - 7 x weekly - 3 sets - 10 reps - Standing Anti-Rotation Press with Anchored Resistance  - 1 x daily - 7 x weekly - 2 sets - 10 reps - 5" hold - Standing Diagonal Chop  - 1 x daily - 7 x  weekly - 2 sets - 10 reps - 5" hold - Shoulder Extension with Resistance - Palms Forward  - 1 x daily - 7 x weekly - 2 sets - 10 reps - 5" hold - Standing Bilateral Low Shoulder Row with Anchored Resistance  - 1 x daily - 7 x weekly - 2 sets - 10 reps -  5" hold  10/28/22 - Reverse Chop with Resistance  - 1 x daily - 7 x weekly - 2 sets - 10 reps ASSESSMENT:  CLINICAL IMPRESSION: Continued with previously completed exercises which are performed well. Began additional glute and core strengthening and tolerates increased resistance with postural strengthening. Good mechanics with functional strengthening. Patient will continue to benefit from physical therapy in order to improve function and reduce impairment.   OBJECTIVE IMPAIRMENTS: Abnormal gait, decreased activity tolerance, decreased balance, decreased endurance, decreased mobility, difficulty walking, decreased ROM, decreased strength, increased muscle spasms, impaired flexibility, improper body mechanics, postural dysfunction, and pain.   ACTIVITY LIMITATIONS: carrying, lifting, bending, standing, squatting, stairs, transfers, hygiene/grooming, locomotion level, and caring for others  PARTICIPATION LIMITATIONS: meal prep, cleaning, laundry, shopping, community activity, and yard work  PERSONAL FACTORS: Time since onset of injury/illness/exacerbation and 3+ comorbidities: Chronic back pain, HLD, increased BMI  are also affecting patient's functional outcome.   REHAB POTENTIAL: Good  CLINICAL DECISION MAKING: Stable/uncomplicated  EVALUATION COMPLEXITY: Low   GOALS: Goals reviewed with patient? Yes  SHORT TERM GOALS: Target date: 10/28/2022    Patient will be independent with HEP in order to improve functional outcomes. Baseline:  Goal status: IN PROGRESS  2.  Patient will report at least 25% improvement in symptoms for improved quality of life. Baseline:  Goal status: IN PROGRESS    LONG TERM GOALS: Target date: 11/18/2022     Patient will report at least 75% improvement in symptoms for improved quality of life. Baseline:  Goal status: IN PROGRESS  2.  Patient will improve FOTO score by at least 5 points in order to indicate improved tolerance to activity. Baseline: 63% function Goal status: IN PROGRESS  3.  Patient will demonstrate at least 25% improvement in lumbar ROM in all restricted planes for improved ability to move trunk while completing chores. Baseline: see above Goal status: IN PROGRESS  4.  Patient will be able to complete 5x STS in under 11.4 seconds in order to reduce the risk of falls. Baseline: 12.14 seconds without UE use, Relies LLE >RLE Goal status: IN PROGRESS  5.  Patient will demonstrate grade of 5/5 MMT grade in all tested musculature as evidence of improved strength to assist with stair ambulation and gait.   Baseline: see above Goal status: IN PROGRESS     PLAN:  PT FREQUENCY: 2x/week  PT DURATION: 6 weeks  PLANNED INTERVENTIONS: Therapeutic exercises, Therapeutic activity, Neuromuscular re-education, Balance training, Gait training, Patient/Family education, Joint manipulation, Joint mobilization, Stair training, Orthotic/Fit training, DME instructions, Aquatic Therapy, Dry Needling, Electrical stimulation, Spinal manipulation, Spinal mobilization, Cryotherapy, Moist heat, Compression bandaging, scar mobilization, Splintting, Taping, Traction, Ultrasound, Ionotophoresis 4mg /ml Dexamethasone, and Manual therapy  PLAN FOR NEXT SESSION: core and hip strength, hip mobility, functional strength.  Add QL stretches and vector stance when ready.      Reola Mosher Lynn Recendiz, PT 10/28/2022, 8:20 AM

## 2022-10-30 ENCOUNTER — Encounter (HOSPITAL_COMMUNITY): Payer: Self-pay | Admitting: Physical Therapy

## 2022-10-30 ENCOUNTER — Ambulatory Visit (HOSPITAL_COMMUNITY): Payer: Managed Care, Other (non HMO) | Admitting: Physical Therapy

## 2022-10-30 DIAGNOSIS — M25551 Pain in right hip: Secondary | ICD-10-CM

## 2022-10-30 DIAGNOSIS — M6281 Muscle weakness (generalized): Secondary | ICD-10-CM

## 2022-10-30 DIAGNOSIS — R29898 Other symptoms and signs involving the musculoskeletal system: Secondary | ICD-10-CM

## 2022-10-30 DIAGNOSIS — M5459 Other low back pain: Secondary | ICD-10-CM

## 2022-10-30 DIAGNOSIS — R2689 Other abnormalities of gait and mobility: Secondary | ICD-10-CM

## 2022-10-30 NOTE — Therapy (Signed)
OUTPATIENT PHYSICAL THERAPY TREATMENT   Patient Name: KENADIE ROYCE MRN: [id number] DOB:1956/02/13, 67 y.o., female Today's Date: 10/30/2022  END OF SESSION:  PT End of Session - 10/30/22 0733     Visit Number 7    Number of Visits 12    Date for PT Re-Evaluation 11/18/22    Authorization Type [employer] Managed    PT Start Time [phone]    PT Stop Time 0811    PT Time Calculation (min) 38 min    Activity Tolerance Patient tolerated treatment well    Behavior During Therapy [facility] for tasks assessed/performed              Past Medical History:  Diagnosis Date   Arthritis    Bell's palsy    GERD (gastroesophageal reflux disease)    Hyperlipidemia    Obesity    Prediabetes    Past Surgical History:  Procedure Laterality Date   ABDOMINAL HYSTERECTOMY     TOTAL ABDOMINAL HYSTERECTOMY     TOTAL KNEE ARTHROPLASTY Right    Patient Active Problem List   Diagnosis Date Noted   Bell's palsy 06/14/2013   GERD (gastroesophageal reflux disease) 09/22/2012   BMI 40.0-44.9, adult (HCC) 09/22/2012    PCP: Shade Flood, MD  REFERRING PROVIDER: Shade Flood, MD  REFERRING DIAG: M54.50 (ICD-10-CM) - Right-sided low back pain without sciatica, unspecified chronicity  Rationale for Evaluation and Treatment: Rehabilitation  THERAPY DIAG:  Pain in right hip  Other low back pain  Muscle weakness (generalized)  Other symptoms and signs involving the musculoskeletal system  Other abnormalities of gait and mobility  ONSET DATE: 6 months  SUBJECTIVE:                                                                                                                                                                                           SUBJECTIVE STATEMENT: Pt stated yesterday was an awesome day. Felt good but some soreness following last session. Still pain in low back/upper glute that bothers her with sitting up straight, getting in car.    EVAL: Patient states low back  pain that began about 6 months ago with insidious onset. She doesn't have any symptoms in her leg. Symptoms were not constant or every day. Meloxicam has helped symptoms. Symptoms were not bothering her over the weekend. Symptoms come and go. Had R knee surgery in the past and abdominal pain last year. Patient states symptoms increase with transitions in/out of low car, increase in activity all day. Symptoms decrease with meds and rest. R TKA 2 years ago. Moved into new house in Jan. 2024 with  steep driveway and stairs which she was not used to doing.   PERTINENT HISTORY:  Chronic back pain, HLD, increased BMI  PAIN:  Are you having pain? Yes: NPRS scale: 0/10 Pain location: R LBP/glute Pain description: stabbing Aggravating factors: in/out of low car, increased activity Relieving factors: med, rest  PRECAUTIONS: None  WEIGHT BEARING RESTRICTIONS: No  FALLS: Has patient fallen in last 6 months? No  OCCUPATION: Geographical information systems officer   PLOF: Independent  PATIENT GOALS: back to feeling better   OBJECTIVE:   PATIENT SURVEYS:  FOTO 63% function  SCREENING FOR RED FLAGS: Bowel or bladder incontinence: No Spinal tumors: No Cauda equina syndrome: No Compression fracture: No Abdominal aneurysm: No  COGNITION: Overall cognitive status: Within functional limits for tasks assessed     SENSATION: WFL  POSTURE: rounded shoulders, forward head, increased thoracic kyphosis, and anterior pelvic tilt  PALPATION: Hypomobile thoracic spine, tender lumbar CPA; TTP R glute max proximally, bilateral glute med/min and piriformis  LUMBAR ROM:   AROM eval  Flexion 25% limited  Extension 50% limited  Right lateral flexion 25% limited *  Left lateral flexion 25% limited *  Right rotation 0% limited  Left rotation 0% limited   (Blank rows = not tested)  LOWER EXTREMITY ROM:   decreased R hip extension PROM and AROM  Active  Right eval Left eval  Hip flexion    Hip extension    Hip  abduction    Hip adduction    Hip internal rotation    Hip external rotation    Knee flexion    Knee extension    Ankle dorsiflexion    Ankle plantarflexion    Ankle inversion    Ankle eversion     (Blank rows = not tested)  LOWER EXTREMITY MMT:    MMT Right eval Left eval  Hip flexion 4+* 5  Hip extension 4- 4-  Hip abduction 4- 4-  Hip adduction    Hip internal rotation    Hip external rotation    Knee flexion 5 5  Knee extension 5 5  Ankle dorsiflexion 5 5  Ankle plantarflexion    Ankle inversion    Ankle eversion     (Blank rows = not tested)    FUNCTIONAL TESTS:  5 times sit to stand: 12.14 seconds without UE use, Relies LLE >RLE   GAIT: Distance walked: 100 feet Assistive device utilized: None Level of assistance: Complete Independence Comments: WFL  TODAY'S TREATMENT:                                                                                                                              DATE:  10/30/22 TTP lower lumbar paraspinals/ proximal R glute max Seated trunk flexion stretch forward, R & L diagonals 10 x 5 second hold each Standing QL stretch 3 x 20 second holds bilateral  Standing hip abduction GTB at knees 2 x 10  Standing hip extension GTB at  knees 2 x 10  Standing chop GTB 1 x 10 bilateral Standing lift GTB 1 x 10 bilateral Step up 6 inch 1 x 10 bilateral Lateral step up 6 inch 2 x 10 bilateral Lateral step down 4 inch 1 x 10 bilateral    10/28/22 Standing lumbar extension 1 x 10 Marching alternating with ab set 10x 5" Standing hip abduction GTB at knees 2 x 10  Standing hip extension GTB at knees 2 x 10  Standing row GTB 2 x 10  Standing shoulder extension GTB 2 x 10 Palof press GTB 2 x 10 bilateral Standing chop GTB 1 x 10 bilateral Standing lift GTB 1 x 10 bilateral Step up 6 inch 2 x 10 Lateral step up 6 inch 1 x 10   10/24/22 Standing: 3D hip excursion (Squat front of mat, Lumbar extension, Rotation- twing of Rt LBP with  rotation to the right,Weight shifting with UE overhead)  Marching alternating with ab set 10x 5"  Heel and toe raise 10x  RTB- diagonal chop 2x 10   Shoulder extension   Rows   Paloff with ab set  Hip abduction 2x 10  Sidestep 2RT with GTB around thigh  10/22/22: STS Standing: Lumbar extension Heel and toe raises 10x Marching alternating with ab set 10x  POE-press up discomfort so DC'd exercise  Supine: LTR 5x 10" DKTC with green ball under heels 1 x 10 with 5 second holds Isometric abdominal sets with theraball 10x5" Isometric oblique with theraball 10x 5"  Sidelying: abduction 10x  10/16/52: 3D hip excursion (weight shift, rotate, STS, lumbar extension) Supine: Bridge Educated ab sets paired with exhale x 3 min Ab set x 1 min March with ab set 10x- increased Rt hip pain LTR 5x 10" SKTC 2x 30" DKTC with green ball under heels 1 x 10 with 5 second holds  Sidelying:  Clam 10x 3"  10/09/22 Standing lumbar extension 1 x 10  Bridge 2x 10  DKTC with green ball under heels 1 x 10 with 5 second holds Supine piriformis stretch 3 x 20 second holds bilateral Supine ab set 1 x 10 x 5 second holds Supine ab set with march 2 x 10 bilateral SLR 1 x 10 bilateral  Standing lumbar extension 1 x 10   10/07/22 Standing lumbar extension 1 x 10  Bridge 1x 10    PATIENT EDUCATION:  Education details:10/09/22:HEP, posture, lumbar roll;  Eval: Patient educated on exam findings, POC, scope of PT, HEP. Person educated: Patient Education method: Explanation, Demonstration, and Handouts Education comprehension: verbalized understanding, returned demonstration, verbal cues required, and tactile cues required  HOME EXERCISE PROGRAM: Access Code: [email or web address] URL: [email or web address]  Date: 10/07/2022 - Standing Lumbar Extension  - 5 x daily - 7 x weekly - 1 sets - 10 reps - Supine Bridge  - 2 x daily - 7 x weekly - 1-2 sets - 10 reps  10/09/22 - Supine Piriformis Stretch  with Towel  - 1 x daily - 7 x weekly - 2 sets - 10 reps - Abdominal Bracing  - 1 x daily - 7 x weekly - 1 sets - 10 reps - 5 second hold - Supine March with Posterior Pelvic Tilt  - 1 x daily - 7 x weekly - 2 sets - 10 reps  10/24/22: - Sit to Stand  - 1 x daily - 7 x weekly - 3 sets - 10 reps - Squat with Chair Touch  - 1 x daily - 7 x weekly -  3 sets - 10 reps - Standing Anti-Rotation Press with Anchored Resistance  - 1 x daily - 7 x weekly - 2 sets - 10 reps - 5" hold - Standing Diagonal Chop  - 1 x daily - 7 x weekly - 2 sets - 10 reps - 5" hold - Shoulder Extension with Resistance - Palms Forward  - 1 x daily - 7 x weekly - 2 sets - 10 reps - 5" hold - Standing Bilateral Low Shoulder Row with Anchored Resistance  - 1 x daily - 7 x weekly - 2 sets - 10 reps - 5" hold  10/28/22 - Reverse Chop with Resistance  - 1 x daily - 7 x weekly - 2 sets - 10 reps ASSESSMENT:  CLINICAL IMPRESSION: Began with trunk flexion stretch for lower lumbar paraspinals/proximal glutes mobility. Continued with previously completed exercises which are performed well with minimal/no cueing for mechanics. Patient will continue to benefit from physical therapy in order to improve function and reduce impairment.   OBJECTIVE IMPAIRMENTS: Abnormal gait, decreased activity tolerance, decreased balance, decreased endurance, decreased mobility, difficulty walking, decreased ROM, decreased strength, increased muscle spasms, impaired flexibility, improper body mechanics, postural dysfunction, and pain.   ACTIVITY LIMITATIONS: carrying, lifting, bending, standing, squatting, stairs, transfers, hygiene/grooming, locomotion level, and caring for others  PARTICIPATION LIMITATIONS: meal prep, cleaning, laundry, shopping, community activity, and yard work  PERSONAL FACTORS: Time since onset of injury/illness/exacerbation and 3+ comorbidities: Chronic back pain, HLD, increased BMI  are also affecting patient's functional outcome.    REHAB POTENTIAL: Good  CLINICAL DECISION MAKING: Stable/uncomplicated  EVALUATION COMPLEXITY: Low   GOALS: Goals reviewed with patient? Yes  SHORT TERM GOALS: Target date: 10/28/2022    Patient will be independent with HEP in order to improve functional outcomes. Baseline:  Goal status: IN PROGRESS  2.  Patient will report at least 25% improvement in symptoms for improved quality of life. Baseline:  Goal status: IN PROGRESS    LONG TERM GOALS: Target date: 11/18/2022    Patient will report at least 75% improvement in symptoms for improved quality of life. Baseline:  Goal status: IN PROGRESS  2.  Patient will improve FOTO score by at least 5 points in order to indicate improved tolerance to activity. Baseline: 63% function Goal status: IN PROGRESS  3.  Patient will demonstrate at least 25% improvement in lumbar ROM in all restricted planes for improved ability to move trunk while completing chores. Baseline: see above Goal status: IN PROGRESS  4.  Patient will be able to complete 5x STS in under 11.4 seconds in order to reduce the risk of falls. Baseline: 12.14 seconds without UE use, Relies LLE >RLE Goal status: IN PROGRESS  5.  Patient will demonstrate grade of 5/5 MMT grade in all tested musculature as evidence of improved strength to assist with stair ambulation and gait.   Baseline: see above Goal status: IN PROGRESS     PLAN:  PT FREQUENCY: 2x/week  PT DURATION: 6 weeks  PLANNED INTERVENTIONS: Therapeutic exercises, Therapeutic activity, Neuromuscular re-education, Balance training, Gait training, Patient/Family education, Joint manipulation, Joint mobilization, Stair training, Orthotic/Fit training, DME instructions, Aquatic Therapy, Dry Needling, Electrical stimulation, Spinal manipulation, Spinal mobilization, Cryotherapy, Moist heat, Compression bandaging, scar mobilization, Splintting, Taping, Traction, Ultrasound, Ionotophoresis 4mg /ml  Dexamethasone, and Manual therapy  PLAN FOR NEXT SESSION: core and hip strength, hip mobility, functional strength.  Add QL stretches and vector stance when ready.      Reola Mosher Harleigh Civello, PT 10/30/2022, 7:33 AM

## 2022-11-05 ENCOUNTER — Encounter (HOSPITAL_COMMUNITY): Payer: Self-pay | Admitting: Physical Therapy

## 2022-11-05 ENCOUNTER — Ambulatory Visit (HOSPITAL_COMMUNITY): Payer: Managed Care, Other (non HMO) | Admitting: Physical Therapy

## 2022-11-05 DIAGNOSIS — R2689 Other abnormalities of gait and mobility: Secondary | ICD-10-CM

## 2022-11-05 DIAGNOSIS — M25551 Pain in right hip: Secondary | ICD-10-CM

## 2022-11-05 DIAGNOSIS — M6281 Muscle weakness (generalized): Secondary | ICD-10-CM

## 2022-11-05 DIAGNOSIS — R29898 Other symptoms and signs involving the musculoskeletal system: Secondary | ICD-10-CM

## 2022-11-05 DIAGNOSIS — M5459 Other low back pain: Secondary | ICD-10-CM

## 2022-11-05 NOTE — Therapy (Signed)
OUTPATIENT PHYSICAL THERAPY TREATMENT   Patient Name: Teresa Strong MRN: [id number] DOB:August 03, 1955, 67 y.o., female Today's Date: 11/05/2022  END OF SESSION:  PT End of Session - 11/05/22 0733     Visit Number 8    Number of Visits 12    Date for PT Re-Evaluation 11/18/22    Authorization Type [employer] Managed    PT Start Time [phone]    PT Stop Time 0811    PT Time Calculation (min) 38 min    Activity Tolerance Patient tolerated treatment well    Behavior During Therapy [facility] for tasks assessed/performed              Past Medical History:  Diagnosis Date   Arthritis    Bell's palsy    GERD (gastroesophageal reflux disease)    Hyperlipidemia    Obesity    Prediabetes    Past Surgical History:  Procedure Laterality Date   ABDOMINAL HYSTERECTOMY     TOTAL ABDOMINAL HYSTERECTOMY     TOTAL KNEE ARTHROPLASTY Right    Patient Active Problem List   Diagnosis Date Noted   Bell's palsy 06/14/2013   GERD (gastroesophageal reflux disease) 09/22/2012   BMI 40.0-44.9, adult (HCC) 09/22/2012    PCP: Shade Flood, MD  REFERRING PROVIDER: Shade Flood, MD  REFERRING DIAG: M54.50 (ICD-10-CM) - Right-sided low back pain without sciatica, unspecified chronicity  Rationale for Evaluation and Treatment: Rehabilitation  THERAPY DIAG:  Pain in right hip  Other low back pain  Muscle weakness (generalized)  Other symptoms and signs involving the musculoskeletal system  Other abnormalities of gait and mobility  ONSET DATE: 6 months  SUBJECTIVE:                                                                                                                                                                                           SUBJECTIVE STATEMENT: Pt stated had a bad Sunday but then yesterday was doing better. She was sitting while bending forward using a magnifier and that aggravated things. Before starting therapy, that would have persisted for a few days. When  she left here last time it was good.   EVAL: Patient states low back pain that began about 6 months ago with insidious onset. She doesn't have any symptoms in her leg. Symptoms were not constant or every day. Meloxicam has helped symptoms. Symptoms were not bothering her over the weekend. Symptoms come and go. Had R knee surgery in the past and abdominal pain last year. Patient states symptoms increase with transitions in/out of low car, increase in activity all day. Symptoms decrease with meds and rest.  R TKA 2 years ago. Moved into new house in Jan. 2024 with steep driveway and stairs which she was not used to doing.   PERTINENT HISTORY:  Chronic back pain, HLD, increased BMI  PAIN:  Are you having pain? Yes: NPRS scale: 0/10 Pain location: R LBP/glute Pain description: stabbing Aggravating factors: in/out of low car, increased activity Relieving factors: med, rest  PRECAUTIONS: None  WEIGHT BEARING RESTRICTIONS: No  FALLS: Has patient fallen in last 6 months? No  OCCUPATION: Geographical information systems officer   PLOF: Independent  PATIENT GOALS: back to feeling better   OBJECTIVE:   PATIENT SURVEYS:  FOTO 63% function  SCREENING FOR RED FLAGS: Bowel or bladder incontinence: No Spinal tumors: No Cauda equina syndrome: No Compression fracture: No Abdominal aneurysm: No  COGNITION: Overall cognitive status: Within functional limits for tasks assessed     SENSATION: WFL  POSTURE: rounded shoulders, forward head, increased thoracic kyphosis, and anterior pelvic tilt  PALPATION: Hypomobile thoracic spine, tender lumbar CPA; TTP R glute max proximally, bilateral glute med/min and piriformis  LUMBAR ROM:   AROM eval  Flexion 25% limited  Extension 50% limited  Right lateral flexion 25% limited *  Left lateral flexion 25% limited *  Right rotation 0% limited  Left rotation 0% limited   (Blank rows = not tested)  LOWER EXTREMITY ROM:   decreased R hip extension PROM and  AROM  Active  Right eval Left eval  Hip flexion    Hip extension    Hip abduction    Hip adduction    Hip internal rotation    Hip external rotation    Knee flexion    Knee extension    Ankle dorsiflexion    Ankle plantarflexion    Ankle inversion    Ankle eversion     (Blank rows = not tested)  LOWER EXTREMITY MMT:    MMT Right eval Left eval  Hip flexion 4+* 5  Hip extension 4- 4-  Hip abduction 4- 4-  Hip adduction    Hip internal rotation    Hip external rotation    Knee flexion 5 5  Knee extension 5 5  Ankle dorsiflexion 5 5  Ankle plantarflexion    Ankle inversion    Ankle eversion     (Blank rows = not tested)    FUNCTIONAL TESTS:  5 times sit to stand: 12.14 seconds without UE use, Relies LLE >RLE   GAIT: Distance walked: 100 feet Assistive device utilized: None Level of assistance: Complete Independence Comments: WFL  TODAY'S TREATMENT:                                                                                                                              DATE:  11/05/22 Seated trunk flexion stretch forward, R & L diagonals 10 x 5 second hold each Standing QL stretch 3 x 20 second holds bilateral Step up with contralateral knee drive 6 inch 2 x 10  bilateral Lateral step up 6 inch 2 x 10 bilateral Lateral step down 4 inch 2 x 10 bilateral  Shoulder press with ab set 1 x 10 bilateral 4# SLS with vectors 5 x 5 second holds bilateral  Weighted retro gait 3 plates 1 x 5  8/46/96 TTP lower lumbar paraspinals/ proximal R glute max Seated trunk flexion stretch forward, R & L diagonals 10 x 5 second hold each Standing QL stretch 3 x 20 second holds bilateral  Standing hip abduction GTB at knees 2 x 10  Standing hip extension GTB at knees 2 x 10  Standing chop GTB 1 x 10 bilateral Standing lift GTB 1 x 10 bilateral Step up 6 inch 1 x 10 bilateral Lateral step up 6 inch 2 x 10 bilateral Lateral step down 4 inch 1 x 10 bilateral     10/28/22 Standing lumbar extension 1 x 10 Marching alternating with ab set 10x 5" Standing hip abduction GTB at knees 2 x 10  Standing hip extension GTB at knees 2 x 10  Standing row GTB 2 x 10  Standing shoulder extension GTB 2 x 10 Palof press GTB 2 x 10 bilateral Standing chop GTB 1 x 10 bilateral Standing lift GTB 1 x 10 bilateral Step up 6 inch 2 x 10 Lateral step up 6 inch 1 x 10   10/24/22 Standing: 3D hip excursion (Squat front of mat, Lumbar extension, Rotation- twing of Rt LBP with rotation to the right,Weight shifting with UE overhead)  Marching alternating with ab set 10x 5"  Heel and toe raise 10x  RTB- diagonal chop 2x 10   Shoulder extension   Rows   Paloff with ab set  Hip abduction 2x 10  Sidestep 2RT with GTB around thigh  10/22/22: STS Standing: Lumbar extension Heel and toe raises 10x Marching alternating with ab set 10x  POE-press up discomfort so DC'd exercise  Supine: LTR 5x 10" DKTC with green ball under heels 1 x 10 with 5 second holds Isometric abdominal sets with theraball 10x5" Isometric oblique with theraball 10x 5"  Sidelying: abduction 10x  10/16/52: 3D hip excursion (weight shift, rotate, STS, lumbar extension) Supine: Bridge Educated ab sets paired with exhale x 3 min Ab set x 1 min March with ab set 10x- increased Rt hip pain LTR 5x 10" SKTC 2x 30" DKTC with green ball under heels 1 x 10 with 5 second holds  Sidelying:  Clam 10x 3"  10/09/22 Standing lumbar extension 1 x 10  Bridge 2x 10  DKTC with green ball under heels 1 x 10 with 5 second holds Supine piriformis stretch 3 x 20 second holds bilateral Supine ab set 1 x 10 x 5 second holds Supine ab set with march 2 x 10 bilateral SLR 1 x 10 bilateral  Standing lumbar extension 1 x 10   10/07/22 Standing lumbar extension 1 x 10  Bridge 1x 10    PATIENT EDUCATION:  Education details:10/09/22:HEP, posture, lumbar roll;  Eval: Patient educated on exam findings, POC,  scope of PT, HEP. Person educated: Patient Education method: Explanation, Demonstration, and Handouts Education comprehension: verbalized understanding, returned demonstration, verbal cues required, and tactile cues required  HOME EXERCISE PROGRAM: Access Code: [email or web address] URL: [email or web address]  Date: 10/07/2022 - Standing Lumbar Extension  - 5 x daily - 7 x weekly - 1 sets - 10 reps - Supine Bridge  - 2 x daily - 7 x weekly - 1-2 sets - 10 reps  10/09/22 - Supine  Piriformis Stretch with Towel  - 1 x daily - 7 x weekly - 2 sets - 10 reps - Abdominal Bracing  - 1 x daily - 7 x weekly - 1 sets - 10 reps - 5 second hold - Supine March with Posterior Pelvic Tilt  - 1 x daily - 7 x weekly - 2 sets - 10 reps  10/24/22: - Sit to Stand  - 1 x daily - 7 x weekly - 3 sets - 10 reps - Squat with Chair Touch  - 1 x daily - 7 x weekly - 3 sets - 10 reps - Standing Anti-Rotation Press with Anchored Resistance  - 1 x daily - 7 x weekly - 2 sets - 10 reps - 5" hold - Standing Diagonal Chop  - 1 x daily - 7 x weekly - 2 sets - 10 reps - 5" hold - Shoulder Extension with Resistance - Palms Forward  - 1 x daily - 7 x weekly - 2 sets - 10 reps - 5" hold - Standing Bilateral Low Shoulder Row with Anchored Resistance  - 1 x daily - 7 x weekly - 2 sets - 10 reps - 5" hold  10/28/22 - Reverse Chop with Resistance  - 1 x daily - 7 x weekly - 2 sets - 10 reps ASSESSMENT:  CLINICAL IMPRESSION: Began with trunk flexion stretch for lower lumbar paraspinals/proximal glutes mobility and QL. Functional strengthening tolerated well and intermittent cueing given for reducing UE support as able. Limited by L knee pain with lateral step down on L stance. Good mechanics with overhead press exercise. Moderate fatigue at end of session.  Patient will continue to benefit from physical therapy in order to improve function and reduce impairment.   OBJECTIVE IMPAIRMENTS: Abnormal gait, decreased activity tolerance,  decreased balance, decreased endurance, decreased mobility, difficulty walking, decreased ROM, decreased strength, increased muscle spasms, impaired flexibility, improper body mechanics, postural dysfunction, and pain.   ACTIVITY LIMITATIONS: carrying, lifting, bending, standing, squatting, stairs, transfers, hygiene/grooming, locomotion level, and caring for others  PARTICIPATION LIMITATIONS: meal prep, cleaning, laundry, shopping, community activity, and yard work  PERSONAL FACTORS: Time since onset of injury/illness/exacerbation and 3+ comorbidities: Chronic back pain, HLD, increased BMI  are also affecting patient's functional outcome.   REHAB POTENTIAL: Good  CLINICAL DECISION MAKING: Stable/uncomplicated  EVALUATION COMPLEXITY: Low   GOALS: Goals reviewed with patient? Yes  SHORT TERM GOALS: Target date: 10/28/2022    Patient will be independent with HEP in order to improve functional outcomes. Baseline:  Goal status: IN PROGRESS  2.  Patient will report at least 25% improvement in symptoms for improved quality of life. Baseline:  Goal status: IN PROGRESS    LONG TERM GOALS: Target date: 11/18/2022    Patient will report at least 75% improvement in symptoms for improved quality of life. Baseline:  Goal status: IN PROGRESS  2.  Patient will improve FOTO score by at least 5 points in order to indicate improved tolerance to activity. Baseline: 63% function Goal status: IN PROGRESS  3.  Patient will demonstrate at least 25% improvement in lumbar ROM in all restricted planes for improved ability to move trunk while completing chores. Baseline: see above Goal status: IN PROGRESS  4.  Patient will be able to complete 5x STS in under 11.4 seconds in order to reduce the risk of falls. Baseline: 12.14 seconds without UE use, Relies LLE >RLE Goal status: IN PROGRESS  5.  Patient will demonstrate grade of 5/5 MMT grade  in all tested musculature as evidence of improved  strength to assist with stair ambulation and gait.   Baseline: see above Goal status: IN PROGRESS     PLAN:  PT FREQUENCY: 2x/week  PT DURATION: 6 weeks  PLANNED INTERVENTIONS: Therapeutic exercises, Therapeutic activity, Neuromuscular re-education, Balance training, Gait training, Patient/Family education, Joint manipulation, Joint mobilization, Stair training, Orthotic/Fit training, DME instructions, Aquatic Therapy, Dry Needling, Electrical stimulation, Spinal manipulation, Spinal mobilization, Cryotherapy, Moist heat, Compression bandaging, scar mobilization, Splintting, Taping, Traction, Ultrasound, Ionotophoresis 4mg /ml Dexamethasone, and Manual therapy  PLAN FOR NEXT SESSION: core and hip strength, hip mobility, functional strength.      Reola Mosher Sye Schroepfer, PT 11/05/2022, 7:33 AM

## 2022-11-07 ENCOUNTER — Ambulatory Visit (HOSPITAL_COMMUNITY): Payer: Managed Care, Other (non HMO) | Attending: Family Medicine | Admitting: Physical Therapy

## 2022-11-07 ENCOUNTER — Encounter (HOSPITAL_COMMUNITY): Payer: Self-pay | Admitting: Physical Therapy

## 2022-11-07 DIAGNOSIS — M6281 Muscle weakness (generalized): Secondary | ICD-10-CM | POA: Insufficient documentation

## 2022-11-07 DIAGNOSIS — R2689 Other abnormalities of gait and mobility: Secondary | ICD-10-CM | POA: Insufficient documentation

## 2022-11-07 DIAGNOSIS — M25551 Pain in right hip: Secondary | ICD-10-CM | POA: Insufficient documentation

## 2022-11-07 DIAGNOSIS — R29898 Other symptoms and signs involving the musculoskeletal system: Secondary | ICD-10-CM | POA: Diagnosis present

## 2022-11-07 DIAGNOSIS — M5459 Other low back pain: Secondary | ICD-10-CM | POA: Insufficient documentation

## 2022-11-07 NOTE — Therapy (Signed)
OUTPATIENT PHYSICAL THERAPY TREATMENT   Patient Name: Teresa Strong MRN: [id number] DOB:04-29-1955, 67 y.o., female Today's Date: 11/07/2022  END OF SESSION:  PT End of Session - 11/07/22 0730     Visit Number 9    Number of Visits 12    Date for PT Re-Evaluation 11/18/22    Authorization Type [employer] Managed    PT Start Time 0730    PT Stop Time 0808    PT Time Calculation (min) 38 min    Activity Tolerance Patient tolerated treatment well    Behavior During Therapy WFL for tasks assessed/performed              Past Medical History:  Diagnosis Date   Arthritis    Bell's palsy    GERD (gastroesophageal reflux disease)    Hyperlipidemia    Obesity    Prediabetes    Past Surgical History:  Procedure Laterality Date   ABDOMINAL HYSTERECTOMY     TOTAL ABDOMINAL HYSTERECTOMY     TOTAL KNEE ARTHROPLASTY Right    Patient Active Problem List   Diagnosis Date Noted   Bell's palsy 06/14/2013   GERD (gastroesophageal reflux disease) 09/22/2012   BMI 40.0-44.9, adult (HCC) 09/22/2012    PCP: Shade Flood, MD  REFERRING PROVIDER: Shade Flood, MD  REFERRING DIAG: M54.50 (ICD-10-CM) - Right-sided low back pain without sciatica, unspecified chronicity  Rationale for Evaluation and Treatment: Rehabilitation  THERAPY DIAG:  Pain in right hip  Other low back pain  Muscle weakness (generalized)  Other symptoms and signs involving the musculoskeletal system  Other abnormalities of gait and mobility  ONSET DATE: 6 months  SUBJECTIVE:                                                                                                                                                                                           SUBJECTIVE STATEMENT: Pt stated some pain this morning. Woke up with it today.   EVAL: Patient states low back pain that began about 6 months ago with insidious onset. She doesn't have any symptoms in her leg. Symptoms were not constant or  every day. Meloxicam has helped symptoms. Symptoms were not bothering her over the weekend. Symptoms come and go. Had R knee surgery in the past and abdominal pain last year. Patient states symptoms increase with transitions in/out of low car, increase in activity all day. Symptoms decrease with meds and rest. R TKA 2 years ago. Moved into new house in Jan. 2024 with steep driveway and stairs which she was not used to doing.   PERTINENT HISTORY:  Chronic back pain, HLD, increased  BMI  PAIN:  Are you having pain? Yes: NPRS scale: 4/10 Pain location: R LBP/glute Pain description: stabbing Aggravating factors: in/out of low car, increased activity Relieving factors: med, rest  PRECAUTIONS: None  WEIGHT BEARING RESTRICTIONS: No  FALLS: Has patient fallen in last 6 months? No  OCCUPATION: Geographical information systems officer   PLOF: Independent  PATIENT GOALS: back to feeling better   OBJECTIVE:   PATIENT SURVEYS:  FOTO 63% function  SCREENING FOR RED FLAGS: Bowel or bladder incontinence: No Spinal tumors: No Cauda equina syndrome: No Compression fracture: No Abdominal aneurysm: No  COGNITION: Overall cognitive status: Within functional limits for tasks assessed     SENSATION: WFL  POSTURE: rounded shoulders, forward head, increased thoracic kyphosis, and anterior pelvic tilt  PALPATION: Hypomobile thoracic spine, tender lumbar CPA; TTP R glute max proximally, bilateral glute med/min and piriformis  LUMBAR ROM:   AROM eval  Flexion 25% limited  Extension 50% limited  Right lateral flexion 25% limited *  Left lateral flexion 25% limited *  Right rotation 0% limited  Left rotation 0% limited   (Blank rows = not tested)  LOWER EXTREMITY ROM:   decreased R hip extension PROM and AROM  Active  Right eval Left eval  Hip flexion    Hip extension    Hip abduction    Hip adduction    Hip internal rotation    Hip external rotation    Knee flexion    Knee extension    Ankle  dorsiflexion    Ankle plantarflexion    Ankle inversion    Ankle eversion     (Blank rows = not tested)  LOWER EXTREMITY MMT:    MMT Right eval Left eval  Hip flexion 4+* 5  Hip extension 4- 4-  Hip abduction 4- 4-  Hip adduction    Hip internal rotation    Hip external rotation    Knee flexion 5 5  Knee extension 5 5  Ankle dorsiflexion 5 5  Ankle plantarflexion    Ankle inversion    Ankle eversion     (Blank rows = not tested)    FUNCTIONAL TESTS:  5 times sit to stand: 12.14 seconds without UE use, Relies LLE >RLE   GAIT: Distance walked: 100 feet Assistive device utilized: None Level of assistance: Complete Independence Comments: WFL  TODAY'S TREATMENT:                                                                                                                              DATE:  11/07/22 Standing lumbar extension 2 x 10 Seated QL stretch 3 x 20 second holds bilateral Seated trunk flexion stretch forward, R & L diagonals 10 x 5 second hold each Step up with contralateral knee drive 6 inch 2 x 10 bilateral Shoulder press with ab set 1 x 10 bilateral 4# Hip hinge with PVC 2 x 10 Weighted retro gait 3 plates 1 x 5 Weighted lateral  gait 2 plates 1 x 5 bilateral  11/05/22 Seated trunk flexion stretch forward, R & L diagonals 10 x 5 second hold each Standing QL stretch 3 x 20 second holds bilateral Step up with contralateral knee drive 6 inch 2 x 10 bilateral Lateral step up 6 inch 2 x 10 bilateral Lateral step down 4 inch 2 x 10 bilateral  Shoulder press with ab set 1 x 10 bilateral 4# SLS with vectors 5 x 5 second holds bilateral  Weighted retro gait 3 plates 1 x 5  1/61/09 TTP lower lumbar paraspinals/ proximal R glute max Seated trunk flexion stretch forward, R & L diagonals 10 x 5 second hold each Standing QL stretch 3 x 20 second holds bilateral  Standing hip abduction GTB at knees 2 x 10  Standing hip extension GTB at knees 2 x 10  Standing chop  GTB 1 x 10 bilateral Standing lift GTB 1 x 10 bilateral Step up 6 inch 1 x 10 bilateral Lateral step up 6 inch 2 x 10 bilateral Lateral step down 4 inch 1 x 10 bilateral    10/28/22 Standing lumbar extension 1 x 10 Marching alternating with ab set 10x 5" Standing hip abduction GTB at knees 2 x 10  Standing hip extension GTB at knees 2 x 10  Standing row GTB 2 x 10  Standing shoulder extension GTB 2 x 10 Palof press GTB 2 x 10 bilateral Standing chop GTB 1 x 10 bilateral Standing lift GTB 1 x 10 bilateral Step up 6 inch 2 x 10 Lateral step up 6 inch 1 x 10   10/24/22 Standing: 3D hip excursion (Squat front of mat, Lumbar extension, Rotation- twing of Rt LBP with rotation to the right,Weight shifting with UE overhead)  Marching alternating with ab set 10x 5"  Heel and toe raise 10x  RTB- diagonal chop 2x 10   Shoulder extension   Rows   Paloff with ab set  Hip abduction 2x 10  Sidestep 2RT with GTB around thigh    PATIENT EDUCATION:  Education details:10/09/22:HEP, posture, lumbar roll;  Eval: Patient educated on exam findings, POC, scope of PT, HEP. Person educated: Patient Education method: Explanation, Demonstration, and Handouts Education comprehension: verbalized understanding, returned demonstration, verbal cues required, and tactile cues required  HOME EXERCISE PROGRAM: Access Code: [email or web address] URL: [email or web address]  Date: 10/07/2022 - Standing Lumbar Extension  - 5 x daily - 7 x weekly - 1 sets - 10 reps - Supine Bridge  - 2 x daily - 7 x weekly - 1-2 sets - 10 reps  10/09/22 - Supine Piriformis Stretch with Towel  - 1 x daily - 7 x weekly - 2 sets - 10 reps - Abdominal Bracing  - 1 x daily - 7 x weekly - 1 sets - 10 reps - 5 second hold - Supine March with Posterior Pelvic Tilt  - 1 x daily - 7 x weekly - 2 sets - 10 reps  10/24/22: - Sit to Stand  - 1 x daily - 7 x weekly - 3 sets - 10 reps - Squat with Chair Touch  - 1 x daily - 7 x weekly - 3  sets - 10 reps - Standing Anti-Rotation Press with Anchored Resistance  - 1 x daily - 7 x weekly - 2 sets - 10 reps - 5" hold - Standing Diagonal Chop  - 1 x daily - 7 x weekly - 2 sets - 10 reps - 5" hold - Shoulder Extension  with Resistance - Palms Forward  - 1 x daily - 7 x weekly - 2 sets - 10 reps - 5" hold - Standing Bilateral Low Shoulder Row with Anchored Resistance  - 1 x daily - 7 x weekly - 2 sets - 10 reps - 5" hold  10/28/22 - Reverse Chop with Resistance  - 1 x daily - 7 x weekly - 2 sets - 10 reps ASSESSMENT:  CLINICAL IMPRESSION: Patient with improving symptoms with exercise this morning. Continued with functional strengthening and core strengthening which is tolerated well. Performs hip hinge with dowel for postural cueing with good mechanics following cueing on first few reps. Discussed regular exercise routine beyond PT sessions. Patient will continue to benefit from physical therapy in order to improve function and reduce impairment.   OBJECTIVE IMPAIRMENTS: Abnormal gait, decreased activity tolerance, decreased balance, decreased endurance, decreased mobility, difficulty walking, decreased ROM, decreased strength, increased muscle spasms, impaired flexibility, improper body mechanics, postural dysfunction, and pain.   ACTIVITY LIMITATIONS: carrying, lifting, bending, standing, squatting, stairs, transfers, hygiene/grooming, locomotion level, and caring for others  PARTICIPATION LIMITATIONS: meal prep, cleaning, laundry, shopping, community activity, and yard work  PERSONAL FACTORS: Time since onset of injury/illness/exacerbation and 3+ comorbidities: Chronic back pain, HLD, increased BMI  are also affecting patient's functional outcome.   REHAB POTENTIAL: Good  CLINICAL DECISION MAKING: Stable/uncomplicated  EVALUATION COMPLEXITY: Low   GOALS: Goals reviewed with patient? Yes  SHORT TERM GOALS: Target date: 10/28/2022    Patient will be independent with HEP in  order to improve functional outcomes. Baseline:  Goal status: IN PROGRESS  2.  Patient will report at least 25% improvement in symptoms for improved quality of life. Baseline:  Goal status: IN PROGRESS    LONG TERM GOALS: Target date: 11/18/2022    Patient will report at least 75% improvement in symptoms for improved quality of life. Baseline:  Goal status: IN PROGRESS  2.  Patient will improve FOTO score by at least 5 points in order to indicate improved tolerance to activity. Baseline: 63% function Goal status: IN PROGRESS  3.  Patient will demonstrate at least 25% improvement in lumbar ROM in all restricted planes for improved ability to move trunk while completing chores. Baseline: see above Goal status: IN PROGRESS  4.  Patient will be able to complete 5x STS in under 11.4 seconds in order to reduce the risk of falls. Baseline: 12.14 seconds without UE use, Relies LLE >RLE Goal status: IN PROGRESS  5.  Patient will demonstrate grade of 5/5 MMT grade in all tested musculature as evidence of improved strength to assist with stair ambulation and gait.   Baseline: see above Goal status: IN PROGRESS     PLAN:  PT FREQUENCY: 2x/week  PT DURATION: 6 weeks  PLANNED INTERVENTIONS: Therapeutic exercises, Therapeutic activity, Neuromuscular re-education, Balance training, Gait training, Patient/Family education, Joint manipulation, Joint mobilization, Stair training, Orthotic/Fit training, DME instructions, Aquatic Therapy, Dry Needling, Electrical stimulation, Spinal manipulation, Spinal mobilization, Cryotherapy, Moist heat, Compression bandaging, scar mobilization, Splintting, Taping, Traction, Ultrasound, Ionotophoresis 4mg /ml Dexamethasone, and Manual therapy  PLAN FOR NEXT SESSION: core and hip strength, hip mobility, functional strength.      Reola Mosher Boykin Baetz, PT 11/07/2022, 7:31 AM

## 2022-11-11 ENCOUNTER — Ambulatory Visit (HOSPITAL_COMMUNITY): Payer: Managed Care, Other (non HMO) | Admitting: Physical Therapy

## 2022-11-11 ENCOUNTER — Encounter (HOSPITAL_COMMUNITY): Payer: Self-pay | Admitting: Physical Therapy

## 2022-11-11 DIAGNOSIS — M25551 Pain in right hip: Secondary | ICD-10-CM

## 2022-11-11 DIAGNOSIS — R29898 Other symptoms and signs involving the musculoskeletal system: Secondary | ICD-10-CM

## 2022-11-11 DIAGNOSIS — M5459 Other low back pain: Secondary | ICD-10-CM

## 2022-11-11 DIAGNOSIS — R2689 Other abnormalities of gait and mobility: Secondary | ICD-10-CM

## 2022-11-11 DIAGNOSIS — M6281 Muscle weakness (generalized): Secondary | ICD-10-CM

## 2022-11-11 NOTE — Therapy (Signed)
OUTPATIENT PHYSICAL THERAPY TREATMENT   Patient Name: KAMY ANTRIM MRN: [id number] DOB:August 04, 1955, 67 y.o., female Today's Date: 11/11/2022  Progress Note   Reporting Period 10/07/22 to 11/11/22   See note below for Objective Data and Assessment of Progress/Goals   END OF SESSION:  PT End of Session - 11/11/22 0733     Visit Number 10    Number of Visits 12    Date for PT Re-Evaluation 11/18/22    Authorization Type [employer] Managed    PT Start Time [phone]    PT Stop Time 0817    PT Time Calculation (min) 43 min    Activity Tolerance Patient tolerated treatment well    Behavior During Therapy [facility] for tasks assessed/performed              Past Medical History:  Diagnosis Date   Arthritis    Bell's palsy    GERD (gastroesophageal reflux disease)    Hyperlipidemia    Obesity    Prediabetes    Past Surgical History:  Procedure Laterality Date   ABDOMINAL HYSTERECTOMY     TOTAL ABDOMINAL HYSTERECTOMY     TOTAL KNEE ARTHROPLASTY Right    Patient Active Problem List   Diagnosis Date Noted   Bell's palsy 06/14/2013   GERD (gastroesophageal reflux disease) 09/22/2012   BMI 40.0-44.9, adult (HCC) 09/22/2012    PCP: Shade Flood, MD  REFERRING PROVIDER: Shade Flood, MD  REFERRING DIAG: M54.50 (ICD-10-CM) - Right-sided low back pain without sciatica, unspecified chronicity  Rationale for Evaluation and Treatment: Rehabilitation  THERAPY DIAG:  Pain in right hip  Other low back pain  Muscle weakness (generalized)  Other symptoms and signs involving the musculoskeletal system  Other abnormalities of gait and mobility  ONSET DATE: 6 months  SUBJECTIVE:                                                                                                                                                                                           SUBJECTIVE STATEMENT: Pt stated HEP going well. Back was feeling good over the weekend. Still doesn't feel good  in/out of car. Patient states 90% improvement with PT intervention. Remains limited by intermittent symptoms with sitting/ car.   EVAL: Patient states low back pain that began about 6 months ago with insidious onset. She doesn't have any symptoms in her leg. Symptoms were not constant or every day. Meloxicam has helped symptoms. Symptoms were not bothering her over the weekend. Symptoms come and go. Had R knee surgery in the past and abdominal pain last year. Patient states symptoms increase with transitions in/out of low  car, increase in activity all day. Symptoms decrease with meds and rest. R TKA 2 years ago. Moved into new house in Jan. 2024 with steep driveway and stairs which she was not used to doing.   PERTINENT HISTORY:  Chronic back pain, HLD, increased BMI  PAIN:  Are you having pain? Yes: NPRS scale: 1-2/10 Pain location: R LBP/glute Pain description: stabbing Aggravating factors: in/out of low car, increased activity Relieving factors: med, rest  PRECAUTIONS: None  WEIGHT BEARING RESTRICTIONS: No  FALLS: Has patient fallen in last 6 months? No  OCCUPATION: Geographical information systems officer   PLOF: Independent  PATIENT GOALS: back to feeling better   OBJECTIVE:   PATIENT SURVEYS:  FOTO 63% function 11/11/22: 66% function  SCREENING FOR RED FLAGS: Bowel or bladder incontinence: No Spinal tumors: No Cauda equina syndrome: No Compression fracture: No Abdominal aneurysm: No  COGNITION: Overall cognitive status: Within functional limits for tasks assessed     SENSATION: WFL  POSTURE: rounded shoulders, forward head, increased thoracic kyphosis, and anterior pelvic tilt  PALPATION: Hypomobile thoracic spine, tender lumbar CPA; TTP R glute max proximally, bilateral glute med/min and piriformis  LUMBAR ROM:   AROM eval 11/11/22  Flexion 25% limited 0% limited   Extension 50% limited 25% limited  Right lateral flexion 25% limited * 25% limited  Left lateral flexion 25% limited *  25% limited  Right rotation 0% limited 0% limited   Left rotation 0% limited 0% limited    (Blank rows = not tested)  LOWER EXTREMITY ROM:   decreased R hip extension PROM and AROM  Active  Right eval Left eval  Hip flexion    Hip extension    Hip abduction    Hip adduction    Hip internal rotation    Hip external rotation    Knee flexion    Knee extension    Ankle dorsiflexion    Ankle plantarflexion    Ankle inversion    Ankle eversion     (Blank rows = not tested)  LOWER EXTREMITY MMT:    MMT Right eval Left eval Right 11/11/22 Left 11/11/22  Hip flexion 4+* 5 5 5   Hip extension 4- 4- 4 4  Hip abduction 4- 4- 4 4  Hip adduction      Hip internal rotation      Hip external rotation      Knee flexion 5 5 5 5   Knee extension 5 5 5 5   Ankle dorsiflexion 5 5 5 5   Ankle plantarflexion      Ankle inversion      Ankle eversion       (Blank rows = not tested)    FUNCTIONAL TESTS:  5 times sit to stand: 12.14 seconds without UE use, Relies LLE >RLE   GAIT: Distance walked: 100 feet Assistive device utilized: None Level of assistance: Complete Independence Comments: WFL  Reassessment FUNCTIONAL TESTS:  5 times sit to stand: 11.39 seconds without UE use   TODAY'S TREATMENT:  DATE:  11/11/22 Reassessment LTR 5 x 5 second holds bilateral DKTC with heels on green ball 10 x 5 second holds Hip hinge with PVC 2 x 10 Lifting mechanics STS 2 x 10  11/07/22 Standing lumbar extension 2 x 10 Seated QL stretch 3 x 20 second holds bilateral Seated trunk flexion stretch forward, R & L diagonals 10 x 5 second hold each Step up with contralateral knee drive 6 inch 2 x 10 bilateral Shoulder press with ab set 1 x 10 bilateral 4# Hip hinge with PVC 2 x 10 Weighted retro gait 3 plates 1 x 5 Weighted lateral gait 2 plates 1 x 5  bilateral  11/05/22 Seated trunk flexion stretch forward, R & L diagonals 10 x 5 second hold each Standing QL stretch 3 x 20 second holds bilateral Step up with contralateral knee drive 6 inch 2 x 10 bilateral Lateral step up 6 inch 2 x 10 bilateral Lateral step down 4 inch 2 x 10 bilateral  Shoulder press with ab set 1 x 10 bilateral 4# SLS with vectors 5 x 5 second holds bilateral  Weighted retro gait 3 plates 1 x 5  0/93/23 TTP lower lumbar paraspinals/ proximal R glute max Seated trunk flexion stretch forward, R & L diagonals 10 x 5 second hold each Standing QL stretch 3 x 20 second holds bilateral  Standing hip abduction GTB at knees 2 x 10  Standing hip extension GTB at knees 2 x 10  Standing chop GTB 1 x 10 bilateral Standing lift GTB 1 x 10 bilateral Step up 6 inch 1 x 10 bilateral Lateral step up 6 inch 2 x 10 bilateral Lateral step down 4 inch 1 x 10 bilateral    10/28/22 Standing lumbar extension 1 x 10 Marching alternating with ab set 10x 5" Standing hip abduction GTB at knees 2 x 10  Standing hip extension GTB at knees 2 x 10  Standing row GTB 2 x 10  Standing shoulder extension GTB 2 x 10 Palof press GTB 2 x 10 bilateral Standing chop GTB 1 x 10 bilateral Standing lift GTB 1 x 10 bilateral Step up 6 inch 2 x 10 Lateral step up 6 inch 1 x 10     PATIENT EDUCATION:  Education details:11/11/22: HEP, reassessment findings, POC; 10/09/22:HEP, posture, lumbar roll;  Eval: Patient educated on exam findings, POC, scope of PT, HEP. Person educated: Patient Education method: Explanation, Demonstration, and Handouts Education comprehension: verbalized understanding, returned demonstration, verbal cues required, and tactile cues required  HOME EXERCISE PROGRAM: Access Code: [email or web address] URL: [email or web address]  Date: 10/07/2022 - Standing Lumbar Extension  - 5 x daily - 7 x weekly - 1 sets - 10 reps - Supine Bridge  - 2 x daily - 7 x weekly - 1-2 sets - 10  reps  10/09/22 - Supine Piriformis Stretch with Towel  - 1 x daily - 7 x weekly - 2 sets - 10 reps - Abdominal Bracing  - 1 x daily - 7 x weekly - 1 sets - 10 reps - 5 second hold - Supine March with Posterior Pelvic Tilt  - 1 x daily - 7 x weekly - 2 sets - 10 reps  10/24/22: - Sit to Stand  - 1 x daily - 7 x weekly - 3 sets - 10 reps - Squat with Chair Touch  - 1 x daily - 7 x weekly - 3 sets - 10 reps - Standing Anti-Rotation Press with Anchored Resistance  - 1  x daily - 7 x weekly - 2 sets - 10 reps - 5" hold - Standing Diagonal Chop  - 1 x daily - 7 x weekly - 2 sets - 10 reps - 5" hold - Shoulder Extension with Resistance - Palms Forward  - 1 x daily - 7 x weekly - 2 sets - 10 reps - 5" hold - Standing Bilateral Low Shoulder Row with Anchored Resistance  - 1 x daily - 7 x weekly - 2 sets - 10 reps - 5" hold  10/28/22 - Reverse Chop with Resistance  - 1 x daily - 7 x weekly - 2 sets - 10 reps  11/11/22 - Supine Lower Trunk Rotation  - 1 x daily - 7 x weekly - 10 reps - 5-10 second hold - Standing Hip Hinge with Dowel  - 1 x daily - 7 x weekly - 2 sets - 10 reps - Sit to Stand with Arms Crossed  - 1 x daily - 7 x weekly - 3 sets - 10 reps  ASSESSMENT:  CLINICAL IMPRESSION: Patient has met 2/2 short term goals and 2/5 long term goals with ability to complete HEP and improvement in symptoms and functional mobility. Remaining goals not met due to continued deficits in strength, ROM, activity tolerance although all have improved since evaluation. Patient has made good progress toward remaining goals. Patient will likely d/c at end of current POC. Continued with core and functional strengthening which are tolerated well. Patient will continue to benefit from skilled physical therapy in order to improve function and reduce impairment.    OBJECTIVE IMPAIRMENTS: Abnormal gait, decreased activity tolerance, decreased balance, decreased endurance, decreased mobility, difficulty walking, decreased ROM,  decreased strength, increased muscle spasms, impaired flexibility, improper body mechanics, postural dysfunction, and pain.   ACTIVITY LIMITATIONS: carrying, lifting, bending, standing, squatting, stairs, transfers, hygiene/grooming, locomotion level, and caring for others  PARTICIPATION LIMITATIONS: meal prep, cleaning, laundry, shopping, community activity, and yard work  PERSONAL FACTORS: Time since onset of injury/illness/exacerbation and 3+ comorbidities: Chronic back pain, HLD, increased BMI  are also affecting patient's functional outcome.   REHAB POTENTIAL: Good  CLINICAL DECISION MAKING: Stable/uncomplicated  EVALUATION COMPLEXITY: Low   GOALS: Goals reviewed with patient? Yes  SHORT TERM GOALS: Target date: 10/28/2022    Patient will be independent with HEP in order to improve functional outcomes. Baseline:  Goal status: MET  2.  Patient will report at least 25% improvement in symptoms for improved quality of life. Baseline:  Goal status: MET    LONG TERM GOALS: Target date: 11/18/2022    Patient will report at least 75% improvement in symptoms for improved quality of life. Baseline:  Goal status: MET  2.  Patient will improve FOTO score by at least 5 points in order to indicate improved tolerance to activity. Baseline: 63% function 11/11/22: 66% function Goal status: IN PROGRESS  3.  Patient will demonstrate at least 25% improvement in lumbar ROM in all restricted planes for improved ability to move trunk while completing chores. Baseline: see above Goal status: IN PROGRESS  4.  Patient will be able to complete 5x STS in under 11.4 seconds in order to reduce the risk of falls. Baseline: 12.14 seconds without UE use, Relies LLE >RLE 11/11/22 5 times sit to stand: 11.39 seconds without UE use Goal status: MET  5.  Patient will demonstrate grade of 5/5 MMT grade in all tested musculature as evidence of improved strength to assist with stair ambulation and gait.  Baseline: see above Goal status: IN PROGRESS     PLAN:  PT FREQUENCY: 2x/week  PT DURATION: 6 weeks  PLANNED INTERVENTIONS: Therapeutic exercises, Therapeutic activity, Neuromuscular re-education, Balance training, Gait training, Patient/Family education, Joint manipulation, Joint mobilization, Stair training, Orthotic/Fit training, DME instructions, Aquatic Therapy, Dry Needling, Electrical stimulation, Spinal manipulation, Spinal mobilization, Cryotherapy, Moist heat, Compression bandaging, scar mobilization, Splintting, Taping, Traction, Ultrasound, Ionotophoresis 4mg /ml Dexamethasone, and Manual therapy  PLAN FOR NEXT SESSION: core and hip strength, hip mobility, functional strength.  Likely D/c at end of POC    Wyman Songster, PT 11/11/2022, 8:18 AM

## 2022-11-13 ENCOUNTER — Ambulatory Visit (HOSPITAL_COMMUNITY): Payer: Managed Care, Other (non HMO)

## 2022-11-13 ENCOUNTER — Encounter (HOSPITAL_COMMUNITY): Payer: Self-pay

## 2022-11-13 DIAGNOSIS — M25551 Pain in right hip: Secondary | ICD-10-CM

## 2022-11-13 DIAGNOSIS — M6281 Muscle weakness (generalized): Secondary | ICD-10-CM

## 2022-11-13 DIAGNOSIS — R29898 Other symptoms and signs involving the musculoskeletal system: Secondary | ICD-10-CM

## 2022-11-13 DIAGNOSIS — R2689 Other abnormalities of gait and mobility: Secondary | ICD-10-CM

## 2022-11-13 DIAGNOSIS — M5459 Other low back pain: Secondary | ICD-10-CM

## 2022-11-13 NOTE — Therapy (Signed)
OUTPATIENT PHYSICAL THERAPY TREATMENT   Patient Name: Teresa Strong MRN: [id number] DOB:1955-05-03, 67 y.o., female Today's Date: 11/13/2022   END OF SESSION:  PT End of Session - 11/13/22 0803     Visit Number 11    Number of Visits 12    Date for PT Re-Evaluation 11/18/22    Authorization Type [employer] Managed    PT Start Time 0732    PT Stop Time 0813    PT Time Calculation (min) 41 min    Activity Tolerance Patient tolerated treatment well    Behavior During Therapy [facility] for tasks assessed/performed               Past Medical History:  Diagnosis Date   Arthritis    Bell's palsy    GERD (gastroesophageal reflux disease)    Hyperlipidemia    Obesity    Prediabetes    Past Surgical History:  Procedure Laterality Date   ABDOMINAL HYSTERECTOMY     TOTAL ABDOMINAL HYSTERECTOMY     TOTAL KNEE ARTHROPLASTY Right    Patient Active Problem List   Diagnosis Date Noted   Bell's palsy 06/14/2013   GERD (gastroesophageal reflux disease) 09/22/2012   BMI 40.0-44.9, adult (HCC) 09/22/2012    PCP: Shade Flood, MD  REFERRING PROVIDER: Shade Flood, MD  REFERRING DIAG: M54.50 (ICD-10-CM) - Right-sided low back pain without sciatica, unspecified chronicity  Rationale for Evaluation and Treatment: Rehabilitation  THERAPY DIAG:  Pain in right hip  Other low back pain  Muscle weakness (generalized)  Other symptoms and signs involving the musculoskeletal system  Other abnormalities of gait and mobility  ONSET DATE: 6 months  SUBJECTIVE:                                                                                                                                                                                           SUBJECTIVE STATEMENT: Pt reports she is feeling good today, no reports of pain currently.  Most difficulty with pain sitting in vehicle.  Reports improved confidence with lifting.  Interested in a theraball for HEP.  EVAL: Patient states  low back pain that began about 6 months ago with insidious onset. She doesn't have any symptoms in her leg. Symptoms were not constant or every day. Meloxicam has helped symptoms. Symptoms were not bothering her over the weekend. Symptoms come and go. Had R knee surgery in the past and abdominal pain last year. Patient states symptoms increase with transitions in/out of low car, increase in activity all day. Symptoms decrease with meds and rest. R TKA 2 years ago. Moved into new house in Jan.  2024 with steep driveway and stairs which she was not used to doing.   PERTINENT HISTORY:  Chronic back pain, HLD, increased BMI  PAIN:  Are you having pain? Yes: NPRS scale: 1-2/10 Pain location: R LBP/glute Pain description: stabbing Aggravating factors: in/out of low car, increased activity Relieving factors: med, rest  PRECAUTIONS: None  WEIGHT BEARING RESTRICTIONS: No  FALLS: Has patient fallen in last 6 months? No  OCCUPATION: Geographical information systems officer   PLOF: Independent  PATIENT GOALS: back to feeling better   OBJECTIVE:   PATIENT SURVEYS:  FOTO 63% function 11/11/22: 66% function  SCREENING FOR RED FLAGS: Bowel or bladder incontinence: No Spinal tumors: No Cauda equina syndrome: No Compression fracture: No Abdominal aneurysm: No  COGNITION: Overall cognitive status: Within functional limits for tasks assessed     SENSATION: WFL  POSTURE: rounded shoulders, forward head, increased thoracic kyphosis, and anterior pelvic tilt  PALPATION: Hypomobile thoracic spine, tender lumbar CPA; TTP R glute max proximally, bilateral glute med/min and piriformis  LUMBAR ROM:   AROM eval 11/11/22  Flexion 25% limited 0% limited   Extension 50% limited 25% limited  Right lateral flexion 25% limited * 25% limited  Left lateral flexion 25% limited * 25% limited  Right rotation 0% limited 0% limited   Left rotation 0% limited 0% limited    (Blank rows = not tested)  LOWER EXTREMITY ROM:    decreased R hip extension PROM and AROM  Active  Right eval Left eval  Hip flexion    Hip extension    Hip abduction    Hip adduction    Hip internal rotation    Hip external rotation    Knee flexion    Knee extension    Ankle dorsiflexion    Ankle plantarflexion    Ankle inversion    Ankle eversion     (Blank rows = not tested)  LOWER EXTREMITY MMT:    MMT Right eval Left eval Right 11/11/22 Left 11/11/22  Hip flexion 4+* 5 5 5   Hip extension 4- 4- 4 4  Hip abduction 4- 4- 4 4  Hip adduction      Hip internal rotation      Hip external rotation      Knee flexion 5 5 5 5   Knee extension 5 5 5 5   Ankle dorsiflexion 5 5 5 5   Ankle plantarflexion      Ankle inversion      Ankle eversion       (Blank rows = not tested)    FUNCTIONAL TESTS:  5 times sit to stand: 12.14 seconds without UE use, Relies LLE >RLE   GAIT: Distance walked: 100 feet Assistive device utilized: None Level of assistance: Complete Independence Comments: WFL  Reassessment FUNCTIONAL TESTS:  5 times sit to stand: 11.39 seconds without UE use   TODAY'S TREATMENT:  DATE:  11/13/22: Sitting on theraball (30in) -sitting posture -marching and  -LAQ Standing:  -Hip hinge with PVC 2 x 10  -Vector stance 3x5"  -Step up with contralateral knee drive 6 inch 2 x 10 bilateral  -Bodycraft walkout retro and sidestep 3PL 5RT  -squat 2x 10 Supine:  -DKTC with heels on green ball 10 x 5 second holds  -isometric rectus abdominis with theraball 5x 5"  -isometric oblique with theraball 5x 5"    11/11/22 Reassessment LTR 5 x 5 second holds bilateral DKTC with heels on green ball 10 x 5 second holds Hip hinge with PVC 2 x 10 Lifting mechanics STS 2 x 10  11/07/22 Standing lumbar extension 2 x 10 Seated QL stretch 3 x 20 second holds bilateral Seated trunk flexion stretch  forward, R & L diagonals 10 x 5 second hold each Step up with contralateral knee drive 6 inch 2 x 10 bilateral Shoulder press with ab set 1 x 10 bilateral 4# Hip hinge with PVC 2 x 10 Weighted retro gait 3 plates 1 x 5 Weighted lateral gait 2 plates 1 x 5 bilateral  11/05/22 Seated trunk flexion stretch forward, R & L diagonals 10 x 5 second hold each Standing QL stretch 3 x 20 second holds bilateral Step up with contralateral knee drive 6 inch 2 x 10 bilateral Lateral step up 6 inch 2 x 10 bilateral Lateral step down 4 inch 2 x 10 bilateral  Shoulder press with ab set 1 x 10 bilateral 4# SLS with vectors 5 x 5 second holds bilateral  Weighted retro gait 3 plates 1 x 5  9/81/19 TTP lower lumbar paraspinals/ proximal R glute max Seated trunk flexion stretch forward, R & L diagonals 10 x 5 second hold each Standing QL stretch 3 x 20 second holds bilateral  Standing hip abduction GTB at knees 2 x 10  Standing hip extension GTB at knees 2 x 10  Standing chop GTB 1 x 10 bilateral Standing lift GTB 1 x 10 bilateral Step up 6 inch 1 x 10 bilateral Lateral step up 6 inch 2 x 10 bilateral Lateral step down 4 inch 1 x 10 bilateral    10/28/22 Standing lumbar extension 1 x 10 Marching alternating with ab set 10x 5" Standing hip abduction GTB at knees 2 x 10  Standing hip extension GTB at knees 2 x 10  Standing row GTB 2 x 10  Standing shoulder extension GTB 2 x 10 Palof press GTB 2 x 10 bilateral Standing chop GTB 1 x 10 bilateral Standing lift GTB 1 x 10 bilateral Step up 6 inch 2 x 10 Lateral step up 6 inch 1 x 10     PATIENT EDUCATION:  Education details:11/11/22: HEP, reassessment findings, POC; 10/09/22:HEP, posture, lumbar roll;  Eval: Patient educated on exam findings, POC, scope of PT, HEP. Person educated: Patient Education method: Explanation, Demonstration, and Handouts Education comprehension: verbalized understanding, returned demonstration, verbal cues required, and  tactile cues required  HOME EXERCISE PROGRAM: Access Code: [email or web address] URL: [email or web address]  Date: 10/07/2022 - Standing Lumbar Extension  - 5 x daily - 7 x weekly - 1 sets - 10 reps - Supine Bridge  - 2 x daily - 7 x weekly - 1-2 sets - 10 reps  10/09/22 - Supine Piriformis Stretch with Towel  - 1 x daily - 7 x weekly - 2 sets - 10 reps - Abdominal Bracing  - 1 x daily - 7 x weekly - 1 sets -  10 reps - 5 second hold - Supine March with Posterior Pelvic Tilt  - 1 x daily - 7 x weekly - 2 sets - 10 reps  10/24/22: - Sit to Stand  - 1 x daily - 7 x weekly - 3 sets - 10 reps - Squat with Chair Touch  - 1 x daily - 7 x weekly - 3 sets - 10 reps - Standing Anti-Rotation Press with Anchored Resistance  - 1 x daily - 7 x weekly - 2 sets - 10 reps - 5" hold - Standing Diagonal Chop  - 1 x daily - 7 x weekly - 2 sets - 10 reps - 5" hold - Shoulder Extension with Resistance - Palms Forward  - 1 x daily - 7 x weekly - 2 sets - 10 reps - 5" hold - Standing Bilateral Low Shoulder Row with Anchored Resistance  - 1 x daily - 7 x weekly - 2 sets - 10 reps - 5" hold  10/28/22 - Reverse Chop with Resistance  - 1 x daily - 7 x weekly - 2 sets - 10 reps  11/11/22 - Supine Lower Trunk Rotation  - 1 x daily - 7 x weekly - 10 reps - 5-10 second hold - Standing Hip Hinge with Dowel  - 1 x daily - 7 x weekly - 2 sets - 10 reps - Sit to Stand with Arms Crossed  - 1 x daily - 7 x weekly - 3 sets - 10 reps  ASSESSMENT:  CLINICAL IMPRESSION: Pt reports interest in purchasing theraball to add to HEP, shown some theraball exercises for core and proximal strengthening.  Encouraged to complete in corner to keep ball stable for fall prevention with seated exercises.  Session focus with core and proximal strengthening, pt tolerated well to all exercises with no reports of pain.  Did require cueing for mechanics with squats and hip hinges, improved with cueing.     OBJECTIVE IMPAIRMENTS: Abnormal gait,  decreased activity tolerance, decreased balance, decreased endurance, decreased mobility, difficulty walking, decreased ROM, decreased strength, increased muscle spasms, impaired flexibility, improper body mechanics, postural dysfunction, and pain.   ACTIVITY LIMITATIONS: carrying, lifting, bending, standing, squatting, stairs, transfers, hygiene/grooming, locomotion level, and caring for others  PARTICIPATION LIMITATIONS: meal prep, cleaning, laundry, shopping, community activity, and yard work  PERSONAL FACTORS: Time since onset of injury/illness/exacerbation and 3+ comorbidities: Chronic back pain, HLD, increased BMI  are also affecting patient's functional outcome.   REHAB POTENTIAL: Good  CLINICAL DECISION MAKING: Stable/uncomplicated  EVALUATION COMPLEXITY: Low   GOALS: Goals reviewed with patient? Yes  SHORT TERM GOALS: Target date: 10/28/2022    Patient will be independent with HEP in order to improve functional outcomes. Baseline:  Goal status: MET  2.  Patient will report at least 25% improvement in symptoms for improved quality of life. Baseline:  Goal status: MET    LONG TERM GOALS: Target date: 11/18/2022    Patient will report at least 75% improvement in symptoms for improved quality of life. Baseline:  Goal status: MET  2.  Patient will improve FOTO score by at least 5 points in order to indicate improved tolerance to activity. Baseline: 63% function 11/11/22: 66% function Goal status: IN PROGRESS  3.  Patient will demonstrate at least 25% improvement in lumbar ROM in all restricted planes for improved ability to move trunk while completing chores. Baseline: see above Goal status: IN PROGRESS  4.  Patient will be able to complete 5x STS in under  11.4 seconds in order to reduce the risk of falls. Baseline: 12.14 seconds without UE use, Relies LLE >RLE 11/11/22 5 times sit to stand: 11.39 seconds without UE use Goal status: MET  5.  Patient will demonstrate  grade of 5/5 MMT grade in all tested musculature as evidence of improved strength to assist with stair ambulation and gait.   Baseline: see above Goal status: IN PROGRESS     PLAN:  PT FREQUENCY: 2x/week  PT DURATION: 6 weeks  PLANNED INTERVENTIONS: Therapeutic exercises, Therapeutic activity, Neuromuscular re-education, Balance training, Gait training, Patient/Family education, Joint manipulation, Joint mobilization, Stair training, Orthotic/Fit training, DME instructions, Aquatic Therapy, Dry Needling, Electrical stimulation, Spinal manipulation, Spinal mobilization, Cryotherapy, Moist heat, Compression bandaging, scar mobilization, Splintting, Taping, Traction, Ultrasound, Ionotophoresis 4mg /ml Dexamethasone, and Manual therapy  PLAN FOR NEXT SESSION: core and hip strength, hip mobility, functional strength.  Likely D/c at end of POC.   Becky Sax, LPTA/CLT; CBIS [phone]  Juel Burrow, PTA 11/13/2022, 12:56 PM

## 2022-11-18 ENCOUNTER — Ambulatory Visit (HOSPITAL_COMMUNITY): Payer: Managed Care, Other (non HMO) | Admitting: Physical Therapy

## 2022-11-18 DIAGNOSIS — M25551 Pain in right hip: Secondary | ICD-10-CM | POA: Diagnosis not present

## 2022-11-18 DIAGNOSIS — R2689 Other abnormalities of gait and mobility: Secondary | ICD-10-CM

## 2022-11-18 DIAGNOSIS — R29898 Other symptoms and signs involving the musculoskeletal system: Secondary | ICD-10-CM

## 2022-11-18 DIAGNOSIS — M6281 Muscle weakness (generalized): Secondary | ICD-10-CM

## 2022-11-18 DIAGNOSIS — M5459 Other low back pain: Secondary | ICD-10-CM

## 2022-11-18 NOTE — Therapy (Addendum)
OUTPATIENT PHYSICAL THERAPY TREATMENT   Patient Name: Teresa Strong MRN: [id number] DOB:10/28/1955, 67 y.o., female Today's Date: 11/18/2022 PHYSICAL THERAPY DISCHARGE SUMMARY  Visits from Start of Care: 12  Current functional level related to goals / functional outcomes: Patient has met all short and long term goals with ability to complete HEP and improvement in symptoms, strength, ROM, activity tolerance, and functional mobility.   Remaining deficits: Intermittent symptoms   Education / Equipment: HEP   Patient agrees to discharge. Patient goals were met. Patient is being discharged due to meeting the stated rehab goals. 11:08 AM, 11/18/22 Wyman Songster PT, DPT Physical Therapist at [facility]    END OF SESSION:  PT End of Session - 11/18/22 0800     Visit Number 12    Number of Visits 12    Date for PT Re-Evaluation 11/18/22    Authorization Type [employer] Managed    PT Start Time [phone]    PT Stop Time 0758    PT Time Calculation (min) 25 min    Activity Tolerance Patient tolerated treatment well    Behavior During Therapy [facility] for tasks assessed/performed                Past Medical History:  Diagnosis Date   Arthritis    Bell's palsy    GERD (gastroesophageal reflux disease)    Hyperlipidemia    Obesity    Prediabetes    Past Surgical History:  Procedure Laterality Date   ABDOMINAL HYSTERECTOMY     TOTAL ABDOMINAL HYSTERECTOMY     TOTAL KNEE ARTHROPLASTY Right    Patient Active Problem List   Diagnosis Date Noted   Bell's palsy 06/14/2013   GERD (gastroesophageal reflux disease) 09/22/2012   BMI 40.0-44.9, adult (HCC) 09/22/2012    PCP: Shade Flood, MD  REFERRING PROVIDER: Shade Flood, MD  REFERRING DIAG: M54.50 (ICD-10-CM) - Right-sided low back pain without sciatica, unspecified chronicity  Rationale for Evaluation and Treatment: Rehabilitation  THERAPY DIAG:  Pain in right hip  Other low back  pain  Muscle weakness (generalized)  Other symptoms and signs involving the musculoskeletal system  Other abnormalities of gait and mobility  ONSET DATE: 6 months  SUBJECTIVE:                                                                                                                                                                                           SUBJECTIVE STATEMENT: Pt reports she feels 95% improved.  Feels she is ready for discharge.    EVAL: Patient states low back pain that began about 6 months  ago with insidious onset. She doesn't have any symptoms in her leg. Symptoms were not constant or every day. Meloxicam has helped symptoms. Symptoms were not bothering her over the weekend. Symptoms come and go. Had R knee surgery in the past and abdominal pain last year. Patient states symptoms increase with transitions in/out of low car, increase in activity all day. Symptoms decrease with meds and rest. R TKA 2 years ago. Moved into new house in Jan. 2024 with steep driveway and stairs which she was not used to doing.   PERTINENT HISTORY:  Chronic back pain, HLD, increased BMI  PAIN:  Are you having pain? Yes: NPRS scale: 1-2/10 Pain location: R LBP/glute Pain description: stabbing Aggravating factors: in/out of low car, increased activity Relieving factors: med, rest  PRECAUTIONS: None  WEIGHT BEARING RESTRICTIONS: No  FALLS: Has patient fallen in last 6 months? No  OCCUPATION: Geographical information systems officer   PLOF: Independent  PATIENT GOALS: back to feeling better   OBJECTIVE:   PATIENT SURVEYS:  FOTO 63% function 11/11/22: 66% function  SCREENING FOR RED FLAGS: Bowel or bladder incontinence: No Spinal tumors: No Cauda equina syndrome: No Compression fracture: No Abdominal aneurysm: No  COGNITION: Overall cognitive status: Within functional limits for tasks assessed     SENSATION: WFL  POSTURE: rounded shoulders, forward head, increased thoracic kyphosis, and  anterior pelvic tilt  PALPATION: Hypomobile thoracic spine, tender lumbar CPA; TTP R glute max proximally, bilateral glute med/min and piriformis  LUMBAR ROM:   AROM eval 11/11/22 11/18/22  Flexion 25% limited 0% limited    Extension 50% limited 25% limited No limitations  Right lateral flexion 25% limited * 25% limited No limitations  Left lateral flexion 25% limited * 25% limited No limitations  Right rotation 0% limited 0% limited    Left rotation 0% limited 0% limited     (Blank rows = not tested)  LOWER EXTREMITY ROM:   decreased R hip extension PROM and AROM   LOWER EXTREMITY MMT:    MMT Right eval Left eval Right 11/11/22 Left 11/11/22 Left 11/18/22 Right 11/18/22  Hip flexion 4+* 5 5 5     Hip extension 4- 4- 4 4 5 5   Hip abduction 4- 4- 4 4 5 5   Hip adduction        Hip internal rotation        Hip external rotation        Knee flexion 5 5 5 5     Knee extension 5 5 5 5     Ankle dorsiflexion 5 5 5 5     Ankle plantarflexion        Ankle inversion        Ankle eversion         (Blank rows = not tested)    FUNCTIONAL TESTS:  5 times sit to stand: 12.14 seconds without UE use, Relies LLE >RLE   GAIT: Distance walked: 100 feet Assistive device utilized: None Level of assistance: Complete Independence Comments: WFL  Reassessment FUNCTIONAL TESTS:  5 times sit to stand: 11.39 seconds without UE use   TODAY'S TREATMENT:  DATE:  11/18/22 FOTO 87% MMT for hip abd/ext 5/5 ROM lumbar ext/lat flex WNL Goal review HEP review  11/13/22: Sitting on theraball (30in) -sitting posture -marching and  -LAQ Standing:  -Hip hinge with PVC 2 x 10  -Vector stance 3x5"  -Step up with contralateral knee drive 6 inch 2 x 10 bilateral  -Bodycraft walkout retro and sidestep 3PL 5RT  -squat 2x 10 Supine:  -DKTC with heels on green ball 10 x 5 second  holds  -isometric rectus abdominis with theraball 5x 5"  -isometric oblique with theraball 5x 5"    11/11/22 Reassessment LTR 5 x 5 second holds bilateral DKTC with heels on green ball 10 x 5 second holds Hip hinge with PVC 2 x 10 Lifting mechanics STS 2 x 10  11/07/22 Standing lumbar extension 2 x 10 Seated QL stretch 3 x 20 second holds bilateral Seated trunk flexion stretch forward, R & L diagonals 10 x 5 second hold each Step up with contralateral knee drive 6 inch 2 x 10 bilateral Shoulder press with ab set 1 x 10 bilateral 4# Hip hinge with PVC 2 x 10 Weighted retro gait 3 plates 1 x 5 Weighted lateral gait 2 plates 1 x 5 bilateral  11/05/22 Seated trunk flexion stretch forward, R & L diagonals 10 x 5 second hold each Standing QL stretch 3 x 20 second holds bilateral Step up with contralateral knee drive 6 inch 2 x 10 bilateral Lateral step up 6 inch 2 x 10 bilateral Lateral step down 4 inch 2 x 10 bilateral  Shoulder press with ab set 1 x 10 bilateral 4# SLS with vectors 5 x 5 second holds bilateral  Weighted retro gait 3 plates 1 x 5  1/61/09 TTP lower lumbar paraspinals/ proximal R glute max Seated trunk flexion stretch forward, R & L diagonals 10 x 5 second hold each Standing QL stretch 3 x 20 second holds bilateral  Standing hip abduction GTB at knees 2 x 10  Standing hip extension GTB at knees 2 x 10  Standing chop GTB 1 x 10 bilateral Standing lift GTB 1 x 10 bilateral Step up 6 inch 1 x 10 bilateral Lateral step up 6 inch 2 x 10 bilateral Lateral step down 4 inch 1 x 10 bilateral    10/28/22 Standing lumbar extension 1 x 10 Marching alternating with ab set 10x 5" Standing hip abduction GTB at knees 2 x 10  Standing hip extension GTB at knees 2 x 10  Standing row GTB 2 x 10  Standing shoulder extension GTB 2 x 10 Palof press GTB 2 x 10 bilateral Standing chop GTB 1 x 10 bilateral Standing lift GTB 1 x 10 bilateral Step up 6 inch 2 x 10 Lateral step up  6 inch 1 x 10     PATIENT EDUCATION:  Education details:11/11/22: HEP, reassessment findings, POC; 10/09/22:HEP, posture, lumbar roll;  Eval: Patient educated on exam findings, POC, scope of PT, HEP. Person educated: Patient Education method: Explanation, Demonstration, and Handouts Education comprehension: verbalized understanding, returned demonstration, verbal cues required, and tactile cues required  HOME EXERCISE PROGRAM: Access Code: [email or web address] URL: [email or web address]  Date: 10/07/2022 - Standing Lumbar Extension  - 5 x daily - 7 x weekly - 1 sets - 10 reps - Supine Bridge  - 2 x daily - 7 x weekly - 1-2 sets - 10 reps  10/09/22 - Supine Piriformis Stretch with Towel  - 1 x daily - 7 x weekly - 2 sets -  10 reps - Abdominal Bracing  - 1 x daily - 7 x weekly - 1 sets - 10 reps - 5 second hold - Supine March with Posterior Pelvic Tilt  - 1 x daily - 7 x weekly - 2 sets - 10 reps  10/24/22: - Sit to Stand  - 1 x daily - 7 x weekly - 3 sets - 10 reps - Squat with Chair Touch  - 1 x daily - 7 x weekly - 3 sets - 10 reps - Standing Anti-Rotation Press with Anchored Resistance  - 1 x daily - 7 x weekly - 2 sets - 10 reps - 5" hold - Standing Diagonal Chop  - 1 x daily - 7 x weekly - 2 sets - 10 reps - 5" hold - Shoulder Extension with Resistance - Palms Forward  - 1 x daily - 7 x weekly - 2 sets - 10 reps - 5" hold - Standing Bilateral Low Shoulder Row with Anchored Resistance  - 1 x daily - 7 x weekly - 2 sets - 10 reps - 5" hold  10/28/22 - Reverse Chop with Resistance  - 1 x daily - 7 x weekly - 2 sets - 10 reps  11/11/22 - Supine Lower Trunk Rotation  - 1 x daily - 7 x weekly - 10 reps - 5-10 second hold - Standing Hip Hinge with Dowel  - 1 x daily - 7 x weekly - 2 sets - 10 reps - Sit to Stand with Arms Crossed  - 1 x daily - 7 x weekly - 3 sets - 10 reps  ASSESSMENT:  CLINICAL IMPRESSION: Remaining objective and functional deficits tested today with improvements in all  areas. Stairs are only remaining challenge, however this is due to pts need for Lt total knee replacement and not due to lumbar issues.  Pt is ordering theraball from [employer] and has all copies of HEP needed.  All goals met with no further skilled need at this time.  Pt is ready for discharge.      OBJECTIVE IMPAIRMENTS: Abnormal gait, decreased activity tolerance, decreased balance, decreased endurance, decreased mobility, difficulty walking, decreased ROM, decreased strength, increased muscle spasms, impaired flexibility, improper body mechanics, postural dysfunction, and pain.   ACTIVITY LIMITATIONS: carrying, lifting, bending, standing, squatting, stairs, transfers, hygiene/grooming, locomotion level, and caring for others  PARTICIPATION LIMITATIONS: meal prep, cleaning, laundry, shopping, community activity, and yard work  PERSONAL FACTORS: Time since onset of injury/illness/exacerbation and 3+ comorbidities: Chronic back pain, HLD, increased BMI  are also affecting patient's functional outcome.   REHAB POTENTIAL: Good  CLINICAL DECISION MAKING: Stable/uncomplicated  EVALUATION COMPLEXITY: Low   GOALS: Goals reviewed with patient? Yes  SHORT TERM GOALS: Target date: 10/28/2022    Patient will be independent with HEP in order to improve functional outcomes. Baseline:  Goal status: MET  2.  Patient will report at least 25% improvement in symptoms for improved quality of life. Baseline:  Goal status: MET    LONG TERM GOALS: Target date: 11/18/2022    Patient will report at least 75% improvement in symptoms for improved quality of life. Baseline:  Goal status: MET  2.  Patient will improve FOTO score by at least 5 points in order to indicate improved tolerance to activity. Baseline: 63% function 11/11/22: 66% function 11/18/22 FOTO 87% Goal status: MET  3.  Patient will demonstrate at least 25% improvement in lumbar ROM in all restricted planes for improved ability to move  trunk while completing  chores. Baseline: see above Goal status: MET  4.  Patient will be able to complete 5x STS in under 11.4 seconds in order to reduce the risk of falls. Baseline: 12.14 seconds without UE use, Relies LLE >RLE 11/11/22 5 times sit to stand: 11.39 seconds without UE use Goal status: MET  5.  Patient will demonstrate grade of 5/5 MMT grade in all tested musculature as evidence of improved strength to assist with stair ambulation and gait.   Baseline: see above Goal status: MET     PLAN:  PT FREQUENCY: 2x/week  PT DURATION: 6 weeks  PLANNED INTERVENTIONS: Therapeutic exercises, Therapeutic activity, Neuromuscular re-education, Balance training, Gait training, Patient/Family education, Joint manipulation, Joint mobilization, Stair training, Orthotic/Fit training, DME instructions, Aquatic Therapy, Dry Needling, Electrical stimulation, Spinal manipulation, Spinal mobilization, Cryotherapy, Moist heat, Compression bandaging, scar mobilization, Splintting, Taping, Traction, Ultrasound, Ionotophoresis 4mg /ml Dexamethasone, and Manual therapy  PLAN FOR NEXT SESSION: discharge as all goals met.    Bascom Levels, Siah Kannan B, PTA 11/18/2022, 8:00 AM

## 2022-11-20 ENCOUNTER — Encounter (HOSPITAL_COMMUNITY): Payer: Managed Care, Other (non HMO)

## 2023-05-20 IMAGING — MG MM DIGITAL SCREENING BILAT W/ TOMO AND CAD
8 series · 8 of 24 positions shown · non-contrast
Comparison: Previous exam(s).

CLINICAL DATA: Screening.

EXAM:
DIGITAL SCREENING BILATERAL MAMMOGRAM WITH TOMOSYNTHESIS AND CAD
TECHNIQUE: Bilateral screening digital craniocaudal and mediolateral oblique
mammograms were obtained. Bilateral screening digital breast
tomosynthesis was performed. The images were evaluated with
computer-aided detection.

[R CC synth-2D]
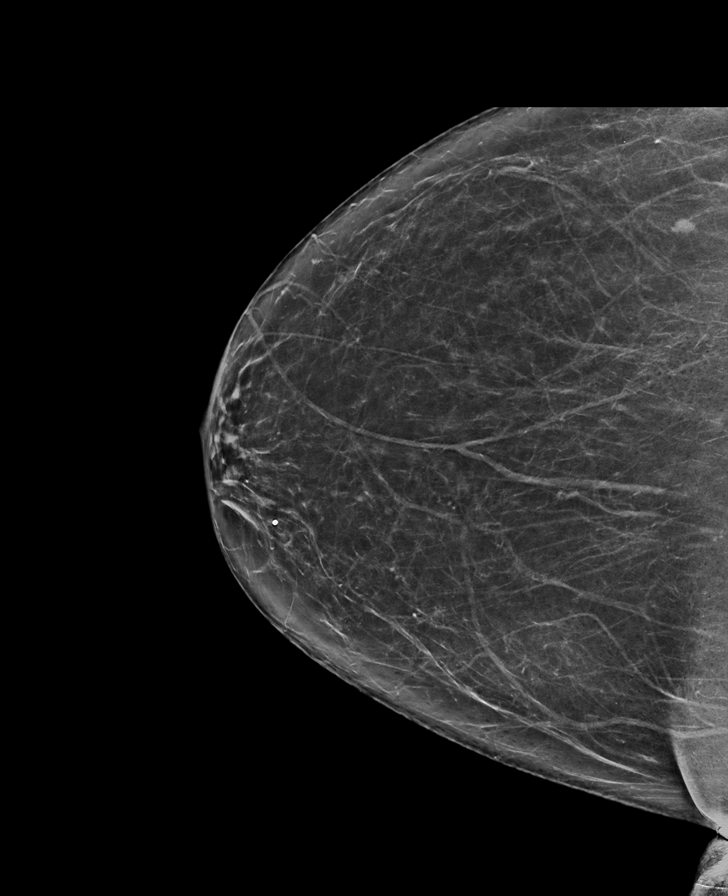

[R MLO synth-2D]
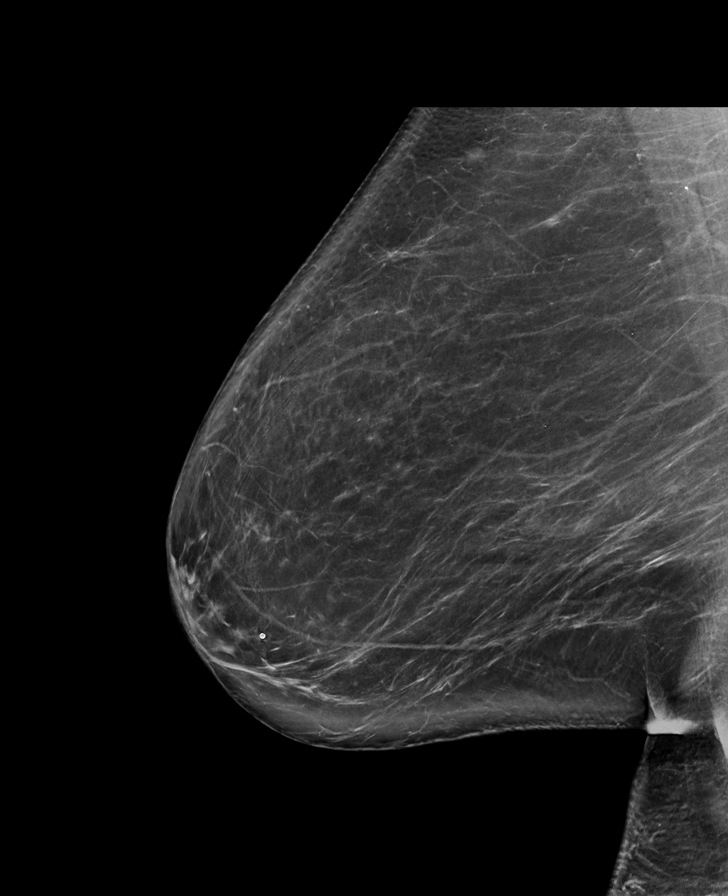

[L MLO synth-2D]
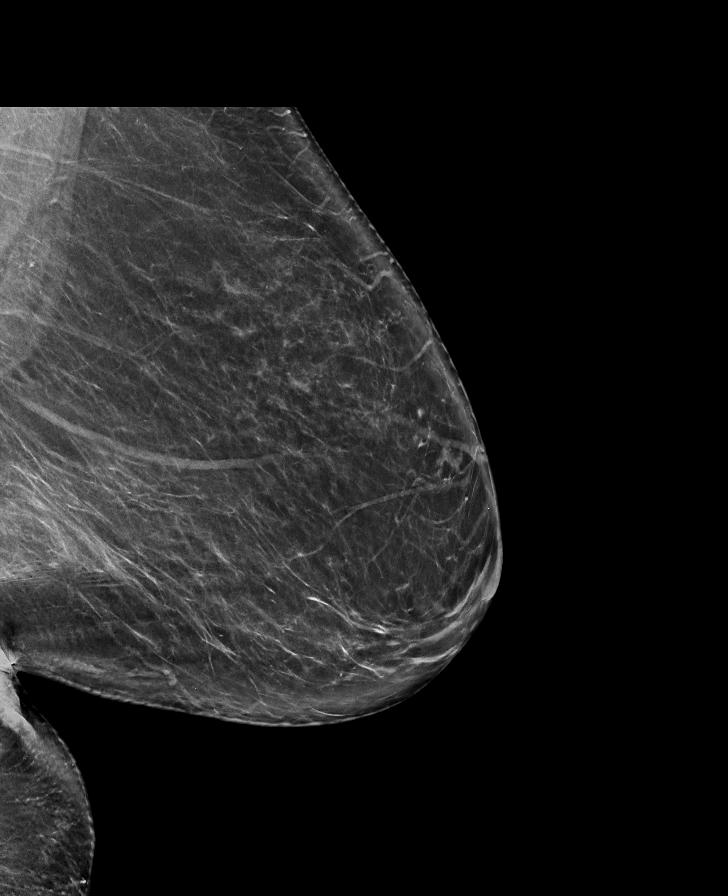

[L CC synth-2D]
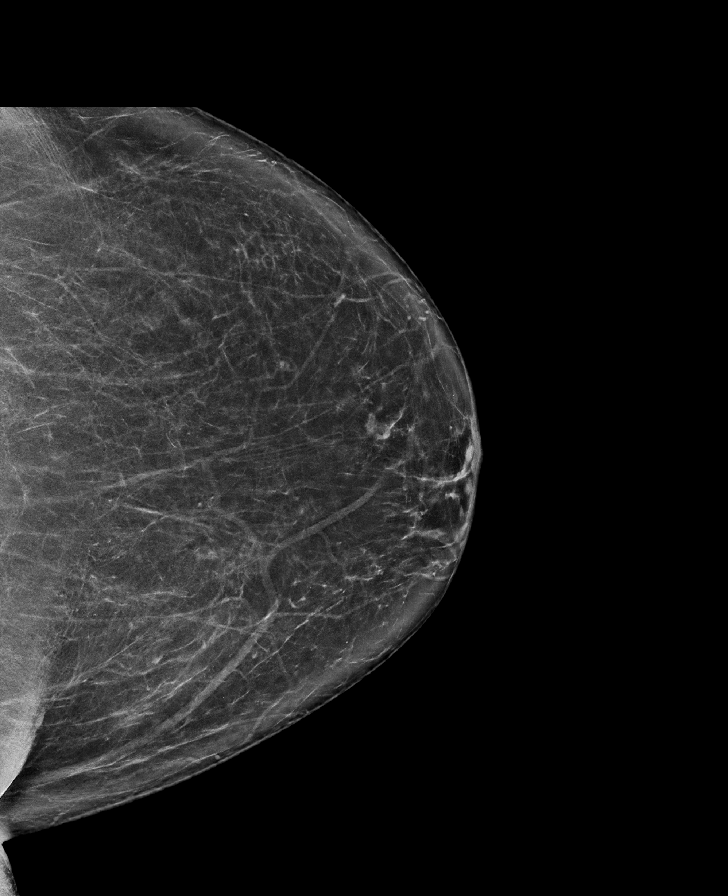

[R MLO tomo · tomo slice 47/93.0]
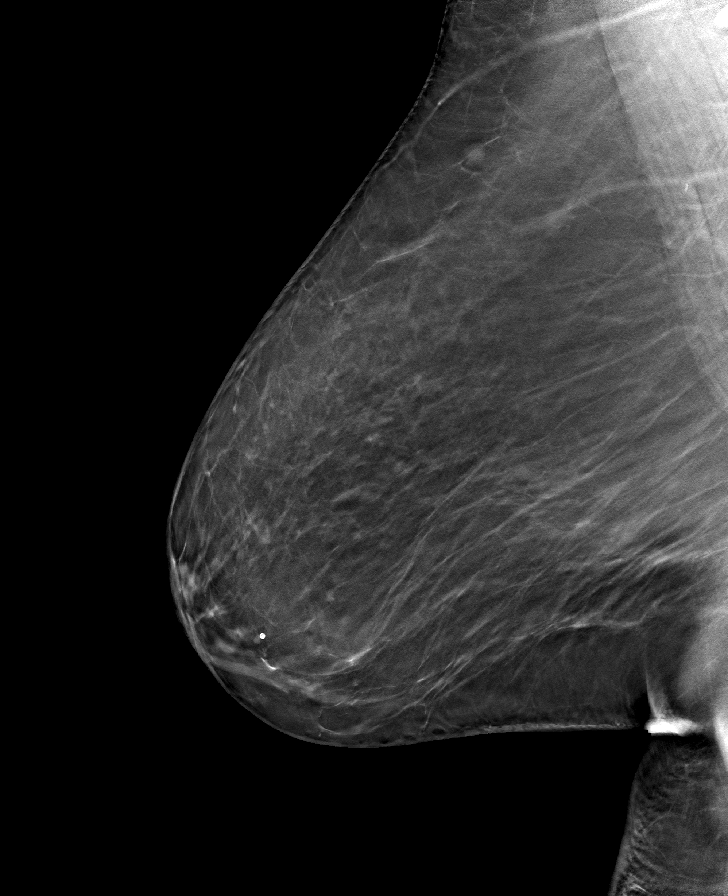

[L MLO tomo · tomo slice 43/86.0]
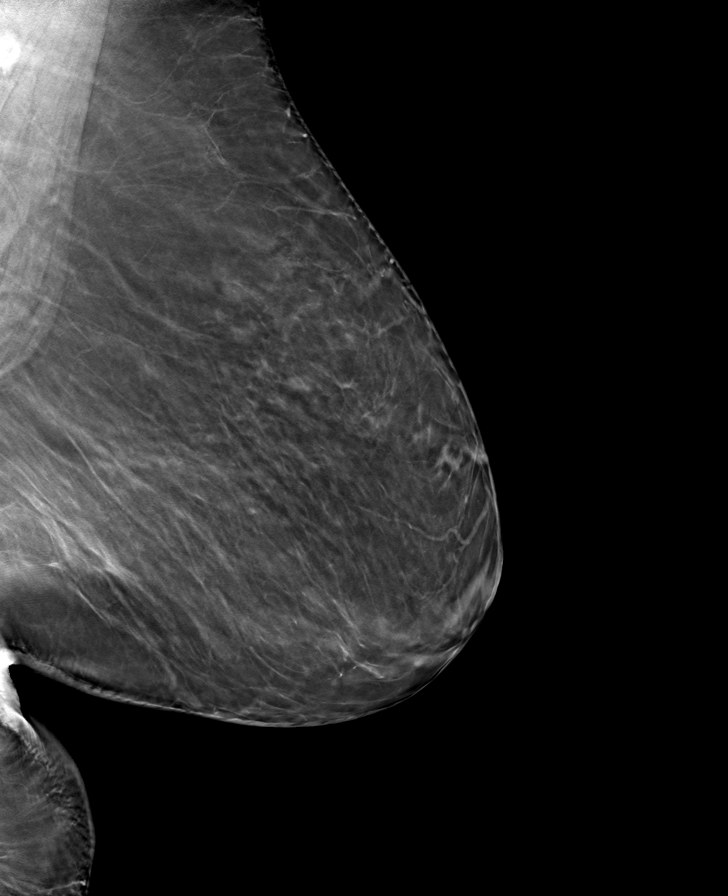

[R CC tomo · tomo slice 39/78.0]
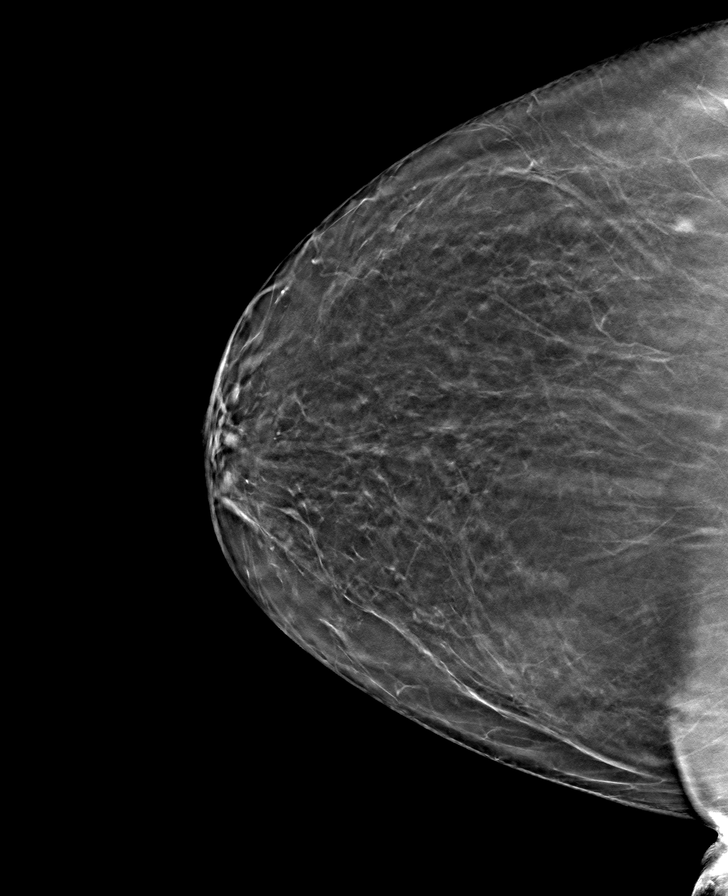

[L CC tomo · tomo slice 41/81.0]
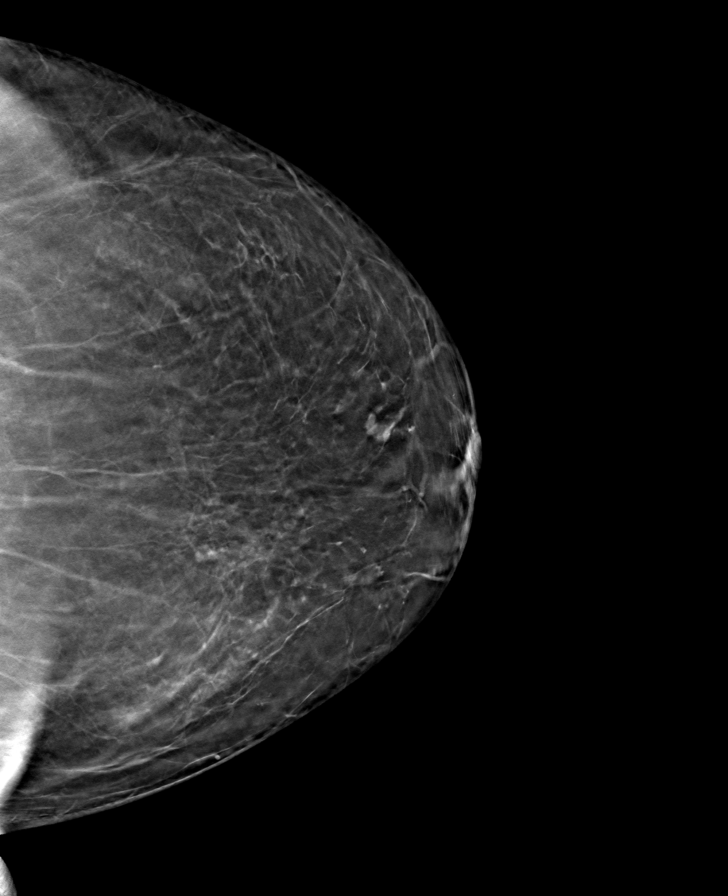

[8 of 24 positions shown; findings below may reference images not displayed]

ACR Breast Density Category c: The breast tissue is heterogeneously
dense, which may obscure small masses.
FINDINGS: There are no findings suspicious for malignancy.
IMPRESSION: No mammographic evidence of malignancy. A result letter of this
screening mammogram will be mailed directly to the patient.

RECOMMENDATION:
Screening mammogram in one year. (Code:[id number])

BI-RADS CATEGORY  1: Negative.

## 2023-12-24 ENCOUNTER — Ambulatory Visit: Payer: Self-pay | Admitting: Family Medicine
